# Patient Record
Sex: Male | Born: 1985 | Race: White | Hispanic: No | Marital: Single | Smoking: Current every day smoker
Health system: Southern US, Community
[De-identification: ages and names within clinical notes are randomized; demographics above are authoritative.]

## PROBLEM LIST (undated history)

## (undated) DIAGNOSIS — F1910 Other psychoactive substance abuse, uncomplicated: Secondary | ICD-10-CM

## (undated) DIAGNOSIS — I519 Heart disease, unspecified: Secondary | ICD-10-CM

## (undated) DIAGNOSIS — J189 Pneumonia, unspecified organism: Secondary | ICD-10-CM

## (undated) DIAGNOSIS — R079 Chest pain, unspecified: Secondary | ICD-10-CM

## (undated) DIAGNOSIS — Q231 Congenital insufficiency of aortic valve: Secondary | ICD-10-CM

## (undated) SURGERY — ECHOCARDIOGRAM, TRANSESOPHAGEAL
Anesthesia: Moderate Sedation

---
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
## 2007-04-07 ENCOUNTER — Emergency Department (HOSPITAL_COMMUNITY): Admission: EM | Admit: 2007-04-07 | Discharge: 2007-04-07 | Payer: Self-pay | Admitting: Emergency Medicine

## 2007-04-14 ENCOUNTER — Emergency Department (HOSPITAL_COMMUNITY): Admission: EM | Admit: 2007-04-14 | Discharge: 2007-04-14 | Payer: Self-pay | Admitting: Emergency Medicine

## 2007-07-01 ENCOUNTER — Emergency Department (HOSPITAL_COMMUNITY): Admission: EM | Admit: 2007-07-01 | Discharge: 2007-07-01 | Payer: Self-pay | Admitting: Emergency Medicine

## 2007-07-10 ENCOUNTER — Emergency Department (HOSPITAL_COMMUNITY): Admission: EM | Admit: 2007-07-10 | Discharge: 2007-07-10 | Payer: Self-pay | Admitting: Emergency Medicine

## 2007-07-18 ENCOUNTER — Emergency Department (HOSPITAL_COMMUNITY): Admission: EM | Admit: 2007-07-18 | Discharge: 2007-07-18 | Payer: Self-pay | Admitting: Emergency Medicine

## 2009-09-08 ENCOUNTER — Emergency Department (HOSPITAL_COMMUNITY): Admission: EM | Admit: 2009-09-08 | Discharge: 2009-09-09 | Payer: Self-pay | Admitting: Emergency Medicine

## 2009-11-14 ENCOUNTER — Emergency Department (HOSPITAL_COMMUNITY): Admission: EM | Admit: 2009-11-14 | Discharge: 2009-11-15 | Payer: Self-pay | Admitting: Emergency Medicine

## 2010-05-07 LAB — BASIC METABOLIC PANEL
BUN: 10 mg/dL (ref 6–23)
Chloride: 98 mEq/L (ref 96–112)
GFR calc non Af Amer: >60 mL/min (ref >60)
Glucose, Bld: 60 mg/dL — ABNORMAL LOW (ref 70–99)
Potassium: 3.8 mEq/L (ref 3.5–5.1)
Sodium: 136 mEq/L (ref 135–145)

## 2010-05-07 LAB — RAPID URINE DRUG SCREEN, HOSP PERFORMED
Amphetamines: NOT DETECTED
Benzodiazepines: NOT DETECTED
Cocaine: NOT DETECTED
Opiates: NOT DETECTED
Tetrahydrocannabinol: NOT DETECTED

## 2010-05-07 LAB — CBC
HCT: 45.2 % (ref 39.0–52.0)
Hemoglobin: 15.8 g/dL (ref 13.0–17.0)
MCH: 32.6 pg (ref 26.0–34.0)
MCHC: 35 g/dL (ref 30.0–36.0)
MCV: 93.1 fL (ref 78.0–100.0)
Platelets: 181 K/uL (ref 150–400)
RBC: 4.85 MIL/uL (ref 4.22–5.81)
RDW: 12.3 % (ref 11.5–15.5)
WBC: 9 K/uL (ref 4.0–10.5)

## 2010-05-07 LAB — DIFFERENTIAL
Basophils Absolute: 0 K/uL (ref 0.0–0.1)
Basophils Relative: 0 % (ref 0–1)
Eosinophils Absolute: 0.3 K/uL (ref 0.0–0.7)
Eosinophils Relative: 3 % (ref 0–5)
Lymphocytes Relative: 12 % (ref 12–46)
Lymphs Abs: 1.1 10*3/uL (ref 0.7–4.0)
Monocytes Absolute: 0.7 10*3/uL (ref 0.1–1.0)
Monocytes Relative: 8 % (ref 3–12)
Neutro Abs: 6.9 10*3/uL (ref 1.7–7.7)
Neutrophils Relative %: 77 % (ref 43–77)

## 2010-05-07 LAB — BASIC METABOLIC PANEL WITH GFR
CO2: 31 meq/L (ref 19–32)
Calcium: 9.3 mg/dL (ref 8.4–10.5)
Creatinine, Ser: 0.99 mg/dL (ref 0.4–1.5)

## 2010-05-07 LAB — ETHANOL

## 2010-05-09 LAB — DIFFERENTIAL
Basophils Absolute: 0 10*3/uL (ref 0.0–0.1)
Lymphocytes Relative: 38 % (ref 12–46)
Lymphs Abs: 2.9 10*3/uL (ref 0.7–4.0)
Monocytes Absolute: 0.4 10*3/uL (ref 0.1–1.0)
Monocytes Relative: 6 % (ref 3–12)
Neutro Abs: 3.9 10*3/uL (ref 1.7–7.7)

## 2010-05-09 LAB — CBC
HCT: 43.9 % (ref 39.0–52.0)
Hemoglobin: 15.3 g/dL (ref 13.0–17.0)
RBC: 4.8 MIL/uL (ref 4.22–5.81)
RDW: 12 % (ref 11.5–15.5)
WBC: 7.7 10*3/uL (ref 4.0–10.5)

## 2010-05-09 LAB — POCT I-STAT, CHEM 8
BUN: 9 mg/dL (ref 6–23)
Calcium, Ion: 1.09 mmol/L — ABNORMAL LOW (ref 1.12–1.32)
Chloride: 105 mEq/L (ref 96–112)
Creatinine, Ser: 1.1 mg/dL (ref 0.4–1.5)

## 2010-05-09 LAB — RAPID URINE DRUG SCREEN, HOSP PERFORMED: Opiates: NOT DETECTED

## 2010-05-09 LAB — ETHANOL

## 2011-05-07 ENCOUNTER — Encounter (HOSPITAL_COMMUNITY): Payer: Self-pay | Admitting: *Deleted

## 2011-05-07 ENCOUNTER — Emergency Department (HOSPITAL_COMMUNITY)
Admission: EM | Admit: 2011-05-07 | Discharge: 2011-05-07 | Disposition: A | Discharge status: Hospice | Payer: Self-pay | Attending: Emergency Medicine | Admitting: Emergency Medicine

## 2011-05-07 DIAGNOSIS — J45909 Unspecified asthma, uncomplicated: Secondary | ICD-10-CM | POA: Insufficient documentation

## 2011-05-07 DIAGNOSIS — K0889 Other specified disorders of teeth and supporting structures: Secondary | ICD-10-CM

## 2011-05-07 DIAGNOSIS — K089 Disorder of teeth and supporting structures, unspecified: Secondary | ICD-10-CM | POA: Insufficient documentation

## 2011-05-07 DIAGNOSIS — K029 Dental caries, unspecified: Secondary | ICD-10-CM | POA: Insufficient documentation

## 2011-05-07 MED ORDER — HYDROCODONE-ACETAMINOPHEN 5-325 MG PO TABS: 1.0000 | ORAL_TABLET | ORAL | Status: AC | PRN

## 2011-05-07 MED ORDER — IBUPROFEN 800 MG PO TABS: 800.0000 mg | ORAL_TABLET | Freq: Three times a day (TID) | ORAL | Status: AC

## 2011-05-07 MED ORDER — PENICILLIN V POTASSIUM 500 MG PO TABS: 500.0000 mg | ORAL_TABLET | Freq: Three times a day (TID) | ORAL | Status: AC

## 2011-05-07 NOTE — Discharge Instructions (Signed)
Take antibiotic as prescribed. Take vicodin as prescribed for severe pain.   Do not drive within four hours of taking this medication (may cause drowsiness or confusion).  Take ibuprofen w/ food up to three times a day, as well.  Follow up with a dentist as soon as possible.  You have been referred to the dentist on call for the hospital and there is a list attached w/ low cost dental clinics in [place] and [place].Toothache Toothaches are usually caused by tooth decay (cavity). However, other causes of toothache include:  Gum disease.   Cracked tooth.   Cracked filling.   Injury.   Jaw problem (temporo mandibular joint or TMJ disorder).   Tooth abscess.   Root sensitivity.   Grinding.   Eruption problems.  Swelling and redness around a painful tooth often means you have a dental abscess. Pain medicine and antibiotics can help reduce symptoms, but you will need to see a dentist within the next few days to have your problem properly evaluated and treated. If tooth decay is the problem, you may need a filling or root canal to save your tooth. If the problem is more severe, your tooth may need to be pulled. SEEK IMMEDIATE MEDICAL CARE IF:  You cannot swallow.   You develop severe swelling, increased redness, or increased pain in your mouth or face.   You have a fever.   You cannot open your mouth adequately.  Document Released: 03/18/2004 Document Revised: 01/28/2011 Document Reviewed: 05/08/2009 [facility] Patient Information 2012 [place], [place].  You should return to the ER if you develop worsening symptoms or facial swelling.

## 2011-05-07 NOTE — ED Provider Notes (Signed)
History     CSN: [id number]  Arrival date & time 05/07/11  1616   First MD Initiated Contact with Patient 05/07/11 1735      Chief Complaint  Patient presents with  . Dental Pain    (Consider location/radiation/quality/duration/timing/severity/associated sxs/prior treatment) HPI History provided by pt.   Pt has had an intermittent, severe, non-radiating, left upper toothache for the past 4-5 months.   Pain gradually worsening and is no longer tolerable.  No modifying factors; no relief w/ OTC pain medication.  Occasionally associated w/ edema and purulent drainage of adjacent gingiva.  No known fever.  Pt does not currently have a dentist.   Past Medical History  Diagnosis Date  . Asthma     History reviewed. No pertinent past surgical history.  History reviewed. No pertinent family history.  History  Substance Use Topics  . Smoking status: Current Everyday Smoker  . Smokeless tobacco: Not on file  . Alcohol Use: Yes      Review of Systems  All other systems reviewed and are negative.    Allergies  Review of patient's allergies indicates no known allergies.  Home Medications   Current Outpatient Rx  Name Route Sig Dispense Refill  . HYDROCODONE-ACETAMINOPHEN 5-325 MG PO TABS Oral Take 1 tablet by mouth every 4 (four) hours as needed for pain. 20 tablet 0  . IBUPROFEN 800 MG PO TABS Oral Take 1 tablet (800 mg total) by mouth 3 (three) times daily. 12 tablet 0  . PENICILLIN V POTASSIUM 500 MG PO TABS Oral Take 1 tablet (500 mg total) by mouth 3 (three) times daily. 30 tablet 0    BP 144/88  Pulse 73  Temp(Src) 97.9 F (36.6 C) (Oral)  Resp 18  SpO2 99%  Physical Exam  Nursing note and vitals reviewed. Constitutional: He is oriented to person, place, and time. He appears well-developed and well-nourished.  HENT:  Head: Normocephalic and atraumatic. No trismus in the jaw.  Mouth/Throat: Uvula is midline and oropharynx is clear and moist.       Left upper  second molar partially avulsed and w/ mild decay.  Non-tender. Adjacent gingiva appears normal.  Eyes:       Normal appearance  Neck: Normal range of motion. Neck supple.  Lymphadenopathy:    He has no cervical adenopathy.  Neurological: He is alert and oriented to person, place, and time.  Psychiatric: He has a normal mood and affect. His behavior is normal.    ED Course  Procedures (including critical care time)  Labs Reviewed - No data to display No results found.   1. Pain, dental       MDM  26yo M presents w/ 4-5 months of left upper second molar pain.  Does not appear to be infected on exam but w/ h/o intermittent edema and purulent drainage of adjacent gingiva, will treat for dental infection.  D/c'd home w/ penicillin, vicodin, ibuprofen and referral to dentist on call as well as list of several low cost dental clinics in [place].          Arie Sabina Shively, [place] 05/07/11 [phone]

## 2011-05-07 NOTE — ED Notes (Signed)
The pt has had a toothache for the past 3-4 days

## 2011-05-07 NOTE — ED Notes (Addendum)
Pt c/o toothache x3 yrs, states "I'm tired of wasting all my money on goody powders." Pt reports he was informed years ago w/abscess to (L) bottom molar and a filling fell out of top (L) molar. Pt reports pain started 4-5 months ago and he is unable to work at times d/t increase pain, pt reports he was out of work 3 months w/last abscess.

## 2011-05-07 NOTE — ED Notes (Signed)
Pt reports the only thing that relieves his pain is to "chug a gallon of liquor." Pain medications do not help, they just make him dizzy

## 2011-05-08 NOTE — ED Provider Notes (Signed)
Medical screening examination/treatment/procedure(s) were performed by non-physician practitioner and as supervising physician I was immediately available for consultation/collaboration.  Aeisha Minarik L Kaislee Chao, MD 05/08/11 1704 

## 2013-01-15 ENCOUNTER — Encounter (HOSPITAL_COMMUNITY): Payer: Self-pay | Admitting: Emergency Medicine

## 2013-01-15 ENCOUNTER — Emergency Department (HOSPITAL_COMMUNITY)
Admission: EM | Admit: 2013-01-15 | Discharge: 2013-01-15 | Disposition: A | Discharge status: Home / Self Care | Payer: Self-pay | Attending: Emergency Medicine | Admitting: Emergency Medicine

## 2013-01-15 DIAGNOSIS — K089 Disorder of teeth and supporting structures, unspecified: Secondary | ICD-10-CM | POA: Insufficient documentation

## 2013-01-15 DIAGNOSIS — F17200 Nicotine dependence, unspecified, uncomplicated: Secondary | ICD-10-CM | POA: Insufficient documentation

## 2013-01-15 DIAGNOSIS — R51 Headache: Secondary | ICD-10-CM | POA: Insufficient documentation

## 2013-01-15 DIAGNOSIS — J45909 Unspecified asthma, uncomplicated: Secondary | ICD-10-CM | POA: Insufficient documentation

## 2013-01-15 DIAGNOSIS — K0889 Other specified disorders of teeth and supporting structures: Secondary | ICD-10-CM

## 2013-01-15 MED ORDER — PENICILLIN V POTASSIUM 500 MG PO TABS: 500.0000 mg | ORAL_TABLET | Freq: Four times a day (QID) | ORAL | Status: AC

## 2013-01-15 MED ORDER — HYDROCODONE-ACETAMINOPHEN 5-325 MG PO TABS: 1.0000 | ORAL_TABLET | ORAL | Status: DC | PRN

## 2013-01-15 MED ORDER — IBUPROFEN 800 MG PO TABS: 800.0000 mg | ORAL_TABLET | Freq: Three times a day (TID) | ORAL | Status: DC | PRN

## 2013-01-15 NOTE — ED Provider Notes (Signed)
CSN: [id number]     Arrival date & time 01/15/13  1230 History  This chart was scribed for non-physician practitioner Trixie Dredge, PA-C, working with Dagmar Hait, MD by Dorothey Baseman, ED Scribe. This patient was seen in room TR04C/TR04C and the patient's care was started at 3:10 PM.    Chief Complaint  Patient presents with  . Dental Pain   The history is provided by the patient. No language interpreter was used.   HPI Comments: Paul Mcdowell. is a 27 y.o. male who presents to the Emergency Department complaining of a constant, severe pain to the left, upper dentition onset about 1-1.5 weeks ago that is exacerbated with eating. Patient reports that a filling fell out from one of the molars in that area about 6 months ago and has been progressively deteriorating. Patient reports associated headache. He reports taking Goody powder, Excedrin, and Tylenol Extra Strength at home without relief. He denies fever, chills, sore throat, shortness of breath, facial swelling, difficulty swallowing or speaking. Patient has no other pertinent medical history.   Past Medical History  Diagnosis Date  . Asthma    No past surgical history on file. No family history on file. History  Substance Use Topics  . Smoking status: Current Every Day Smoker  . Smokeless tobacco: Not on file  . Alcohol Use: Yes    Review of Systems  Constitutional: Negative for fever and chills.  HENT: Positive for dental problem. Negative for facial swelling, sore throat and trouble swallowing.   Respiratory: Negative for shortness of breath.   Neurological: Positive for headaches. Negative for speech difficulty.    Allergies  Review of patient's allergies indicates no known allergies.  Home Medications   Current Outpatient Rx  Name  Route  Sig  Dispense  Refill  . acetaminophen (TYLENOL) 500 MG tablet   Oral   Take 1,000 mg by mouth every 6 (six) hours as needed for moderate pain.         .  Aspirin-Acetaminophen-Caffeine (GOODY HEADACHE PO)   Oral   Take 2 packets by mouth every 4 (four) hours as needed (pain).         . Diphenhydramine-APAP, sleep, (EXCEDRIN PM PO)   Oral   Take 2 tablets by mouth at bedtime as needed (pain/sleep).         . Multiple Vitamin (ONE-A-DAY MENS PO)   Oral   Take 1 tablet by mouth daily.          Triage Vitals: BP 148/81  Pulse 80  Temp(Src) 98.1 F (36.7 C) (Oral)  Resp 20  Ht 5\' 11"  (1.803 m)  Wt 151 lb (68.493 kg)  BMI 21.07 kg/m2  SpO2 100%  Physical Exam  Nursing note and vitals reviewed. Constitutional: He is oriented to person, place, and time. He appears well-developed and well-nourished. No distress.  HENT:  Head: Normocephalic and atraumatic.  Mouth/Throat: Oropharynx is clear and moist.  Left, upper 3rd molar with remote fracture and decay that is tender to palpation. No adjacent edema or erythema of the gingiva. No facial swelling.   Eyes: Conjunctivae are normal.  Neck: Normal range of motion. Neck supple.  No paratracheal tenderness.   Pulmonary/Chest: Effort normal. No respiratory distress.  Abdominal: He exhibits no distension.  Musculoskeletal: Normal range of motion.  Lymphadenopathy:    He has no cervical adenopathy.  Neurological: He is alert and oriented to person, place, and time.  Skin: Skin is warm and dry. He is  not diaphoretic.  Psychiatric: He has a normal mood and affect. His behavior is normal.    ED Course  Procedures (including critical care time)  DIAGNOSTIC STUDIES: Oxygen Saturation is 100% on room air, normal by my interpretation.    COORDINATION OF CARE: 3:31 PM- Advised patient to follow up with the referred dentist. Will discharge patient with antibiotics and pain medication. Discussed treatment plan with patient at bedside and patient verbalized agreement.     Labs Review Labs Reviewed - No data to display Imaging Review No results found.  EKG Interpretation   None        MDM   1. Pain, dental    Pt with left upper molar dental pain in decayed tooth.  Pain is new as of this week. No obvious abscess but will treat for infection given tenderness and constant pain.  Afebrile, nontoxic.  No airway concerns.  Discussed result, findings, treatment, and follow up  with patient.  Pt given return precautions.  Pt verbalizes understanding and agrees with plan.      I doubt any other EMC precluding discharge at this time including, but not necessarily limited to the following: deep space head or neck infection, ludwig's angina.    I personally performed the services described in this documentation, which was scribed in my presence. The recorded information has been reviewed and is accurate.     Trixie Dredge, PA-C 01/15/13 1540

## 2013-01-15 NOTE — ED Notes (Signed)
Patient states L upper back molar had a filling that fell out about 6 months ago.   Patient states it has steadily been chipping away since.  Patient started having pain just recently and wants to have extracted.   Patient states has been taking goody powder, excedrin and tylenol at home for 1 wk.

## 2013-01-16 NOTE — ED Provider Notes (Signed)
Medical screening examination/treatment/procedure(s) were performed by non-physician practitioner and as supervising physician I was immediately available for consultation/collaboration.  EKG Interpretation   None        Shon Baton, MD 01/16/13 1037

## 2013-03-08 ENCOUNTER — Emergency Department (HOSPITAL_COMMUNITY): Payer: Self-pay

## 2013-03-08 ENCOUNTER — Encounter (HOSPITAL_COMMUNITY): Payer: Self-pay | Admitting: Emergency Medicine

## 2013-03-08 DIAGNOSIS — F17200 Nicotine dependence, unspecified, uncomplicated: Secondary | ICD-10-CM | POA: Insufficient documentation

## 2013-03-08 DIAGNOSIS — R071 Chest pain on breathing: Secondary | ICD-10-CM | POA: Insufficient documentation

## 2013-03-08 DIAGNOSIS — Z79899 Other long term (current) drug therapy: Secondary | ICD-10-CM | POA: Insufficient documentation

## 2013-03-08 LAB — POCT I-STAT TROPONIN I: TROPONIN I, POC: 0.02 ng/mL (ref 0.00–0.08)

## 2013-03-08 NOTE — ED Notes (Signed)
Pt also states SOB

## 2013-03-08 NOTE — ED Notes (Signed)
Pt states left side sharp CP that is intermittent. Smoking makes the pain feel worse, nothing makes the pain feel better

## 2013-03-09 ENCOUNTER — Emergency Department (HOSPITAL_COMMUNITY)
Admission: EM | Admit: 2013-03-09 | Discharge: 2013-03-09 | Disposition: A | Discharge status: Home / Self Care | Payer: Self-pay | Attending: Emergency Medicine | Admitting: Emergency Medicine

## 2013-03-09 DIAGNOSIS — R071 Chest pain on breathing: Secondary | ICD-10-CM

## 2013-03-09 DIAGNOSIS — R0789 Other chest pain: Secondary | ICD-10-CM

## 2013-03-09 LAB — BASIC METABOLIC PANEL
BUN: 10 mg/dL (ref 6–23)
CALCIUM: 9.2 mg/dL (ref 8.4–10.5)
CO2: 22 mEq/L (ref 19–32)
Chloride: 100 mEq/L (ref 96–112)
Creatinine, Ser: 1.02 mg/dL (ref 0.50–1.35)
GFR calc Af Amer: >90 mL/min (ref >90)
GLUCOSE: 74 mg/dL (ref 70–99)
POTASSIUM: 3.9 meq/L (ref 3.7–5.3)
SODIUM: 140 meq/L (ref 137–147)

## 2013-03-09 LAB — CBC
HCT: 47.9 % (ref 39.0–52.0)
HEMOGLOBIN: 17.3 g/dL — AB (ref 13.0–17.0)
MCH: 32.2 pg (ref 26.0–34.0)
MCHC: 36.1 g/dL — AB (ref 30.0–36.0)
MCV: 89 fL (ref 78.0–100.0)
PLATELETS: 244 10*3/uL (ref 150–400)
RBC: 5.38 MIL/uL (ref 4.22–5.81)
RDW: 11.9 % (ref 11.5–15.5)
WBC: 8.1 10*3/uL (ref 4.0–10.5)

## 2013-03-09 LAB — PRO B NATRIURETIC PEPTIDE: PRO B NATRI PEPTIDE: 14.9 pg/mL (ref 0–125)

## 2013-03-09 MED ORDER — ALBUTEROL SULFATE HFA 108 (90 BASE) MCG/ACT IN AERS
1.0000 | INHALATION_SPRAY | RESPIRATORY_TRACT | Status: DC | PRN
  Administered 2013-03-09: 2 via RESPIRATORY_TRACT
  Filled 2013-03-09: qty 6.7

## 2013-03-09 MED ORDER — NAPROXEN 500 MG PO TABS: 500.0000 mg | ORAL_TABLET | Freq: Two times a day (BID) | ORAL | Status: DC

## 2013-03-09 MED ORDER — NAPROXEN 250 MG PO TABS
500.0000 mg | ORAL_TABLET | Freq: Two times a day (BID) | ORAL | Status: DC
Start: 2013-03-09 — End: 2013-03-09
  Administered 2013-03-09: 500 mg via ORAL
  Filled 2013-03-09: qty 2

## 2013-03-09 NOTE — Discharge Instructions (Signed)
Chest Wall Pain °Chest wall pain is pain in or around the bones and muscles of your chest. It may take up to 6 weeks to get better. It may take longer if you must stay physically active in your work and activities.  °CAUSES  °Chest wall pain may happen on its own. However, it may be caused by: °· A viral illness like the flu. °· Injury. °· Coughing. °· Exercise. °· Arthritis. °· Fibromyalgia. °· Shingles. °HOME CARE INSTRUCTIONS  °· Avoid overtiring physical activity. Try not to strain or perform activities that cause pain. This includes any activities using your chest or your abdominal and side muscles, especially if heavy weights are used. °· Put ice on the sore area. °· Put ice in a plastic bag. °· Place a towel between your skin and the bag. °· Leave the ice on for 15-20 minutes per hour while awake for the first 2 days. °· Only take over-the-counter or prescription medicines for pain, discomfort, or fever as directed by your caregiver. °SEEK IMMEDIATE MEDICAL CARE IF:  °· Your pain increases, or you are very uncomfortable. °· You have a fever. °· Your chest pain becomes worse. °· You have new, unexplained symptoms. °· You have nausea or vomiting. °· You feel sweaty or lightheaded. °· You have a cough with phlegm (sputum), or you cough up blood. °MAKE SURE YOU:  °· Understand these instructions. °· Will watch your condition. °· Will get help right away if you are not doing well or get worse. °Document Released: 02/08/2005 Document Revised: 05/03/2011 Document Reviewed: 10/05/2010 °[employer]® Patient Information ©2014 [employer]. ° °Costochondritis °Costochondritis, sometimes called Tietze syndrome, is a swelling and irritation (inflammation) of the tissue (cartilage) that connects your ribs with your breastbone (sternum). It causes pain in the chest and rib area. Costochondritis usually goes away on its own over time. It can take up to 6 weeks or longer to get better, especially if you are unable to limit your  activities. °CAUSES  °Some cases of costochondritis have no known cause. Possible causes include: °· Injury (trauma). °· Exercise or activity such as lifting. °· Severe coughing. °SIGNS AND SYMPTOMS °· Pain and tenderness in the chest and rib area. °· Pain that gets worse when coughing or taking deep breaths. °· Pain that gets worse with specific movements. °DIAGNOSIS  °Your health care provider will do a physical exam and ask about your symptoms. Chest X-rays or other tests may be done to rule out other problems. °TREATMENT  °Costochondritis usually goes away on its own over time. Your health care provider may prescribe medicine to help relieve pain. °HOME CARE INSTRUCTIONS  °· Avoid exhausting physical activity. Try not to strain your ribs during normal activity. This would include any activities using chest, abdominal, and side muscles, especially if heavy weights are used. °· Apply ice to the affected area for the first 2 days after the pain begins. °· Put ice in a plastic bag. °· Place a towel between your skin and the bag. °· Leave the ice on for 20 minutes, 2 3 times a day. °· Only take over-the-counter or prescription medicines as directed by your health care provider. °SEEK MEDICAL CARE IF: °· You have redness or swelling at the rib joints. These are signs of infection. °· Your pain does not go away despite rest or medicine. °SEEK IMMEDIATE MEDICAL CARE IF:  °· Your pain increases or you are very uncomfortable. °· You have shortness of breath or difficulty breathing. °· You   cough up blood. °· You have worse chest pains, sweating, or vomiting. °· You have a fever or persistent symptoms for more than 2 3 days. °· You have a fever and your symptoms suddenly get worse. °MAKE SURE YOU:  °· Understand these instructions. °· Will watch your condition. °· Will get help right away if you are not doing well or get worse. °Document Released: 11/18/2004 Document Revised: 11/29/2012 Document Reviewed:  09/12/2012 °Exit[employer]® Patient Information ©2014 [employer]. ° °

## 2013-03-09 NOTE — ED Provider Notes (Signed)
CSN: [id number]     Arrival date & time 03/08/13  2322 History   First MD Initiated Contact with Patient 03/09/13 [phone]     Chief Complaint  Patient presents with  . Chest Pain   (Consider location/radiation/quality/duration/timing/severity/associated sxs/prior Treatment) HPI 28 year old male presents to emergency room with complaint of left upper chest.  Pain.  Pain started tonight around midnight while he was sitting watching TV.  Pain is sharp, worse with movement and palpation.  He's had similar pains in the past, but is never sought medical care.  Pain usually lasts a few minutes, and then resolves.  Patient currently smokes one half pack per day.  He reports is decreased from almost 2 packs a few months ago.  Patient denies any significant medical problems.  No family history of coronary disease.  Pain is currently resolved without intervention. Past Medical History  Diagnosis Date  . Asthma    No past surgical history on file. No family history on file. History  Substance Use Topics  . Smoking status: Current Every Day Smoker -- 0.50 packs/day    Types: Cigarettes  . Smokeless tobacco: Not on file  . Alcohol Use: Yes    Review of Systems  See History of Present Illness; otherwise all other systems are reviewed and negative Allergies  Molds & smuts  Home Medications   Current Outpatient Rx  Name  Route  Sig  Dispense  Refill  . albuterol-ipratropium (COMBIVENT) 18-103 MCG/ACT inhaler   Inhalation   Inhale 2 puffs into the lungs 2 (two) times daily.         . Multiple Vitamin (ONE-A-DAY MENS PO)   Oral   Take 1 tablet by mouth 2 (two) times daily.          . naproxen (NAPROSYN) 500 MG tablet   Oral   Take 1 tablet (500 mg total) by mouth 2 (two) times daily with a meal.   30 tablet   0    BP 143/77  Pulse 64  Temp(Src) 98.1 F (36.7 C) (Oral)  Resp 16  SpO2 100% Physical Exam  Nursing note and vitals reviewed. Constitutional: He is oriented to person,  place, and time. He appears well-developed and well-nourished.  HENT:  Head: Normocephalic and atraumatic.  Nose: Nose normal.  Mouth/Throat: Oropharynx is clear and moist.  Eyes: Conjunctivae and EOM are normal. Pupils are equal, round, and reactive to light.  Neck: Normal range of motion. Neck supple. No JVD present. No tracheal deviation present. No thyromegaly present.  Cardiovascular: Normal rate, regular rhythm, normal heart sounds and intact distal pulses.  Exam reveals no gallop and no friction rub.   No murmur heard. Pulmonary/Chest: Effort normal and breath sounds normal. No stridor. No respiratory distress. He has no wheezes. He has no rales. He exhibits no tenderness (tender to palpation to left upper chest and also along sternal maarrgiin on left side.).  Abdominal: Soft. Bowel sounds are normal. He exhibits no distension and no mass. There is no tenderness. There is no rebound and no guarding.  Musculoskeletal: Normal range of motion. He exhibits no edema and no tenderness.  Lymphadenopathy:    He has no cervical adenopathy.  Neurological: He is alert and oriented to person, place, and time. He exhibits normal muscle tone. Coordination normal.  Skin: Skin is warm and dry. No rash noted. No erythema. No pallor.  Psychiatric: He has a normal mood and affect. His behavior is normal. Judgment and thought content normal.  ED Course  Procedures (including critical care time) Labs Review Labs Reviewed  CBC - Abnormal; Notable for the following:    Hemoglobin 17.3 (*)    MCHC 36.1 (*)    All other components within normal limits  BASIC METABOLIC PANEL  PRO B NATRIURETIC PEPTIDE  POCT I-STAT TROPONIN I   Imaging Review Dg Chest 2 View  03/09/2013   CLINICAL DATA:  Chest pain.  EXAM: CHEST  2 VIEW  COMPARISON:  None.  FINDINGS: Normal heart size and mediastinal contours. No acute infiltrate or edema. No effusion or pneumothorax. No acute osseous findings.  IMPRESSION: No active  cardiopulmonary disease.   Electronically Signed   By: Tiburcio PeaJonathan  Watts M.D.   On: 03/09/2013 00:54    EKG Interpretation    Date/Time:  Thursday March 08 2013 23:26:54 EST Ventricular Rate:  81 PR Interval:  132 QRS Duration: 86 QT Interval:  356 QTC Calculation: 413 R Axis:   87 Text Interpretation:  Normal sinus rhythm Minimal voltage criteria for LVH, may be normal variant Borderline ECG No old tracing to compare Confirmed by Danila Eddie  MD, Krystin Keeven ([id number]) on 03/09/2013 4:23:58 AM            MDM   1. Chest wall pain   2. Costochondral chest pain    28 year old male with chest wall pain.  Will prescribe NSAIDs.   Olivia Mackielga M Sabrina Keough, MD 03/09/13 [phone]

## 2013-03-09 NOTE — ED Notes (Signed)
Pt states that he was having an episode of CP that felt like it triggered a twitching in his arms. Pt states that his pain got worse with movement, taking a deep breath. Pt denies N/V, SOB

## 2013-06-12 ENCOUNTER — Emergency Department (HOSPITAL_COMMUNITY)
Admission: EM | Admit: 2013-06-12 | Discharge: 2013-06-12 | Disposition: A | Discharge status: Home / Self Care | Payer: Self-pay | Attending: Emergency Medicine | Admitting: Emergency Medicine

## 2013-06-12 ENCOUNTER — Encounter (HOSPITAL_COMMUNITY): Payer: Self-pay | Admitting: Emergency Medicine

## 2013-06-12 DIAGNOSIS — J45909 Unspecified asthma, uncomplicated: Secondary | ICD-10-CM | POA: Insufficient documentation

## 2013-06-12 DIAGNOSIS — F17200 Nicotine dependence, unspecified, uncomplicated: Secondary | ICD-10-CM | POA: Insufficient documentation

## 2013-06-12 DIAGNOSIS — K002 Abnormalities of size and form of teeth: Secondary | ICD-10-CM | POA: Insufficient documentation

## 2013-06-12 DIAGNOSIS — K047 Periapical abscess without sinus: Secondary | ICD-10-CM | POA: Insufficient documentation

## 2013-06-12 MED ORDER — IBUPROFEN 800 MG PO TABS: 800.0000 mg | ORAL_TABLET | Freq: Three times a day (TID) | ORAL | Status: DC

## 2013-06-12 MED ORDER — PENICILLIN V POTASSIUM 500 MG PO TABS: 500.0000 mg | ORAL_TABLET | Freq: Four times a day (QID) | ORAL | Status: AC

## 2013-06-12 MED ORDER — OXYCODONE-ACETAMINOPHEN 5-325 MG PO TABS: 2.0000 | ORAL_TABLET | ORAL | Status: DC | PRN

## 2013-06-12 NOTE — ED Provider Notes (Signed)
CSN: [id number]     Arrival date & time 06/12/13  0116 History   First MD Initiated Contact with Patient 06/12/13 0151     Chief Complaint  Patient presents with  . Dental Pain     (Consider location/radiation/quality/duration/timing/severity/associated sxs/prior Treatment) HPI History per patient. Chronic recurrent left upper molar pain. More severe over the last 2 days, tried to pull his tooth out tonight with pliers. He was able to get a chunk of his tooth out. No facial swelling. No fevers. No vomiting. No difficulty swallowing or breathing. Hurts to chew on that side. Pain is sharp and moderate to severe. No known alleviating factors. Taking goodie powders with intermittent minimal relief.  Past Medical History  Diagnosis Date  . Asthma    History reviewed. No pertinent past surgical history. No family history on file. History  Substance Use Topics  . Smoking status: Current Every Day Smoker -- 0.50 packs/day    Types: Cigarettes  . Smokeless tobacco: Not on file  . Alcohol Use: Yes    Review of Systems  Constitutional: Negative for fever and chills.  HENT: Positive for dental problem. Negative for sore throat.   Eyes: Negative for pain.  Respiratory: Negative for shortness of breath.   Cardiovascular: Negative for chest pain.  Gastrointestinal: Negative for abdominal pain.  Genitourinary: Negative for difficulty urinating.  Musculoskeletal: Negative for neck pain.  Skin: Negative for rash.  Neurological: Negative for weakness and headaches.  All other systems reviewed and are negative.     Allergies  Molds & smuts  Home Medications   Prior to Admission medications   Not on File   BP 138/80  Pulse 79  Temp(Src) 98 F (36.7 C) (Oral)  Resp 18  SpO2 99% Physical Exam  Constitutional: He is oriented to person, place, and time. He appears well-developed and well-nourished.  HENT:  Head: Normocephalic and atraumatic.  Left Ear: External ear normal.  Very  poor dentition. Tender left upper first molar. No associated trismus, gingival swelling or fluctuance, facial erythema or swelling.   Eyes: EOM are normal. Pupils are equal, round, and reactive to light.  Neck: Neck supple.  Cardiovascular: Regular rhythm and intact distal pulses.   Pulmonary/Chest: Effort normal. No respiratory distress.  Musculoskeletal: Normal range of motion. He exhibits no edema.  Lymphadenopathy:    He has no cervical adenopathy.  Neurological: He is alert and oriented to person, place, and time.  Skin: Skin is warm and dry.    ED Course  Dental Date/Time: 06/12/2013 2:52 AM Performed by: Sunnie NielsenPITZ, Aquila Menzie Authorized by: Sunnie NielsenPITZ, Tava Peery Consent: Verbal consent obtained. Risks and benefits: risks, benefits and alternatives were discussed Consent given by: patient Patient understanding: patient states understanding of the procedure being performed Required items: required blood products, implants, devices, and special equipment available Patient identity confirmed: verbally with patient Time out: Immediately prior to procedure a "time out" was called to verify the correct patient, procedure, equipment, support staff and site/side marked as required. Preparation: Patient was prepped and draped in the usual sterile fashion. Local anesthesia used: yes Local anesthetic: bupivacaine 0.5% with epinephrine Anesthetic total: 1.8 ml Patient tolerance: Patient tolerated the procedure well with no immediate complications. Comments: Local dental block performed to left upper first molar - adequate anesthesia achieved   (including critical care time) Labs Review Labs Reviewed - No data to display  Plan discharge home with prescription for penicillin, Percocet and Motrin. Dental referral provided. Return precautions provided. MDM   Diagnosis: Dental  abscess  Chronic dental pain , tried to pull his tooth out at home tonight  Dental block performed Vital Signs and nursing notes  reviewed and considered     Sunnie NielsenBrian Yosiel Thieme, MD 06/12/13 [phone]

## 2013-06-12 NOTE — Discharge Instructions (Signed)
Abscess An abscess is an infected area that contains a collection of pus and debris.It can occur in almost any part of the body. An abscess is also known as a furuncle or boil. CAUSES  An abscess occurs when tissue gets infected. This can occur from blockage of oil or sweat glands, infection of hair follicles, or a minor injury to the skin. As the body tries to fight the infection, pus collects in the area and creates pressure under the skin. This pressure causes pain. People with weakened immune systems have difficulty fighting infections and get certain abscesses more often.  SYMPTOMS Usually an abscess develops on the skin and becomes a painful mass that is red, warm, and tender. If the abscess forms under the skin, you may feel a moveable soft area under the skin. Some abscesses break open (rupture) on their own, but most will continue to get worse without care. The infection can spread deeper into the body and eventually into the bloodstream, causing you to feel ill.  DIAGNOSIS  Your caregiver will take your medical history and perform a physical exam. A sample of fluid may also be taken from the abscess to determine what is causing your infection. TREATMENT  Your caregiver may prescribe antibiotic medicines to fight the infection. However, taking antibiotics alone usually does not cure an abscess. Your caregiver may need to make a small cut (incision) in the abscess to drain the pus. In some cases, gauze is packed into the abscess to reduce pain and to continue draining the area. HOME CARE INSTRUCTIONS   Only take over-the-counter or prescription medicines for pain, discomfort, or fever as directed by your caregiver.  If you were prescribed antibiotics, take them as directed. Finish them even if you start to feel better.  If gauze is used, follow your caregiver's directions for changing the gauze.  To avoid spreading the infection:  Keep your draining abscess covered with a  bandage.  Wash your hands well.  Do not share personal care items, towels, or whirlpools with others.  Avoid skin contact with others.  Keep your skin and clothes clean around the abscess.  Keep all follow-up appointments as directed by your caregiver. SEEK MEDICAL CARE IF:   You have increased pain, swelling, redness, fluid drainage, or bleeding.  You have muscle aches, chills, or a general ill feeling.  You have a fever. MAKE SURE YOU:   Understand these instructions.  Will watch your condition.  Will get help right away if you are not doing well or get worse. Document Released: 11/18/2004 Document Revised: 08/10/2011 Document Reviewed: 04/23/2011 [facility] Patient Information 2014 [place], [place].  Dental Extraction A dental extraction procedure refers to a routine tooth extraction performed by your dentist. The procedure depends on where and how the tooth is positioned. The procedure can be very quick, sometimes lasting only seconds. Reasons for dental extraction include:  Tooth decay.  Infections (abcesses).  The need to make room for other teeth.  Gum disease s where the supporting bone has been destroyed.  Fractures of the tooth leaving it unrestorable.  Extra teeth (supernumerary) or grossly malformed teeth.  Baby teeththat have not fallen out in time and have not permitted the the permanent teeth to erupt properly.  In preparation for braces where there is not enough room to align the teeth properly.  Not enough room for wisdom teeth (particularly those that are impacted).  Prior to receiving radiation to the head and neck,teeth in the field of radiation may  need to be extracted. LET YOUR CAREGIVER KNOW ABOUT:  Any allergies.  All medicines you are taking:  Including herbs, eye drops, over-the-counter medications, and creams.  Blood thinners (anticoagulants), aspirin or other drugs that may affect blood clotting.  Use of steroids (through mouth or  as creams).  Previous problems with anesthetics, including local anesthetics.  History of bleeding or blood problems.  Previous surgery.  Possibility of pregnancy if this applies.  Smoking history.  Any health problems. RISKS AND COMPLICATIONS As with any procedure, complications may occur, but they can usually be managed by your caregiver. General surgical complications may include:  Reaction to anesthesia.  Damage to surrounding teeth, nerves, tissues, or structures.  Infection.  Bleeding. With appropriate treatment and care after surgery, the following complications are very uncommon:  Dry socket (blood clot does not form or stay in place over empty socket). This can delay healing.  Incomplete extraction of roots.  Jawbone injury, pain, or weakness. BEFORE THE PROCEDURE  Your dental care provider will:  Take a medical and dental history.  Take an X-ray to evaluate the circumstances and how to best extract the tooth.  Do an oral exam.  Depending on the situation, antibiotics may be given before or after the extraction, or before and after.  Your caregivers may review the procedure, the local anaesthesia and/or sedation being used, and what to expect after the procedure with you.  If needed, your dentist may give you a form of sedation, either by medicine you swallow, gas, or intravenously (IV). This will help to relieve anxiety. Complicated extractions may require the use of general anaesthesia. It is important to follow your caregiver's instructions prior to your procedure to avoid complications. Steps before your procedure may include:  Alert your caregiver if you feel ill (sore throat, fever, upset stomach, etc.) in the days leading up to your procedure.  Stop taking certain medications for several days prior to your procedure such as blood thinners.  Take certain medications, such as antibiotics.  Avoid eating and drinking for several hours before the  procedure. This will help you to avoid complications from the sedation or anaesthesia.  Sign a patient consent form.  Have a friend or family member drive you to the dentist and drive you home after the procedure.  Wear comfortable, loose clothing. Limit makeup and jewelry.  Quit smoking. If you are a smoker, this will raise the chances of a healing problem after your procedure. If you are thinking about quitting, talk to your surgeon about how long before the operation you should stop smoking. You may also get help from your primary caregiver. PROCEDURE Dental extraction is typically done as an outpatient procedure. IV sedation, local anesthesia, or both may be used. It will keep you comfortable and free of pain during the procedure.  There are 2 types of extractions:  Simple extraction involves a tooth that is visible in the mouth and above the gum line. After local anesthetic is given by injection, and the area is numbed, the dentist will loosen the tooth with a special instrument (elevator). Then another instrument (forceps) will be used to grasp the tooth and remove it from its socket. During the procedure you will feel some pressure, but you should not feel pain. If you do feel pain, tell your dentist. The open socket will be cleaned. Dressings (gauze) will be placed in the socket to reduce bleeding.  Surgical extractions are used if the tooth has not come into the mouth  or the tooth is broken off below the gum line. The dentist will make a cut (incision) in the gum and may have to remove some of the bone around the tooth to aid in the removal of the tooth. After removal, stitches (sutures) may be required to close area to help in healing and control bleeding. For some surgical extractions, you may need a general anesthetic or IV sedation (through the vein). After both types of extractions, you may be given pain medication or other drugs to help healing. Other post operative instructions will  be given by your dental caregiver. AFTER THE PROCEDURE  You will have gauze in your mouth where the tooth was removed. Gentle pressure on the gauze for up to 1 hour will help to control bleeding.  A blood clot will begin to form over the open socket. This is normal. Do not touch the area or rinse it.  Your pain will be controlled with medication and self-care.  You will be given detailed instructions for care after surgery. PROGNOSIS While some discomfort is normal after tooth extraction, most patients recover fully in just a few days. SEEK IMMEDIATE DENTAL CARE  You have uncontrolled bleeding, marked swelling, or severe pain.  You develop a fever, difficulty swallowing, or other severe symptoms.  You have questions or concerns. Document Released: 02/08/2005 Document Revised: 05/03/2011 Document Reviewed: 05/15/2010 [facility] Patient Information 2014 [place], [place].   Emergency Department Resource Guide 1) Find a Doctor and Pay Out of Pocket Although you won't have to find out who is covered by your insurance plan, it is a good idea to ask around and get recommendations. You will then need to call the office and see if the doctor you have chosen will accept you as a new patient and what types of options they offer for patients who are self-pay. Some doctors offer discounts or will set up payment plans for their patients who do not have insurance, but you will need to ask so you aren't surprised when you get to your appointment.  2) Contact Your Local Health Department Not all health departments have doctors that can see patients for sick visits, but many do, so it is worth a call to see if yours does. If you don't know where your local health department is, you can check in your phone book. The CDC also has a tool to help you locate your state's health department, and many state websites also have listings of all of their local health departments.  3) Find a Walk-in Clinic If your  illness is not likely to be very severe or complicated, you may want to try a walk in clinic. These are popping up all over the country in pharmacies, drugstores, and shopping centers. They're usually staffed by nurse practitioners or physician assistants that have been trained to treat common illnesses and complaints. They're usually fairly quick and inexpensive. However, if you have serious medical issues or chronic medical problems, these are probably not your best option.  No Primary Care Doctor: - Call Health Connect at  [phone] - they can help you locate a primary care doctor that  accepts your insurance, provides certain services, etc. - Physician Referral Service- [phone]  Chronic Pain Problems: Organization         Address  Phone   Notes  [facility]  [phone] Patients need to be referred by their primary care doctor.   Medication Assistance: Organization  Address  Phone   Notes  [facility] Medication [facility] [address] [place], [place] [address] [phone] --Must be a resident of [facility] -- Must have NO insurance coverage whatsoever (no Medicaid/ Medicare, etc.) -- The pt. MUST have a primary care doctor that directs their care regularly and follows them in the community   [facility]ist  [phone]   [employer]  [phone]    Agencies that provide inexpensive medical care: Organization         Address  Phone   Notes  [facility]  [phone]   [facility]    [phone]   [facility] [address] [place], [place] [address] [phone]   [facility] [address] [place], [place] [phone]   Planned Parenthood    [phone]   [facility]    [phone]   [facility]  [address], [place] Phone:  [phone], Fax:  [phone] Hours of Operation:   9 am - 6 pm, M-F.  Also accepts Medicaid/Medicare and self-pay.  [facility]  [address], [place] Phone: [phone], Fax: [phone]. Hours of Operation:  8:30 am - 5:30 pm, M-F.  Also accepts Medicaid and self-pay.  [facility] [place] [address] [place] Phone: [phone]   [facility] [address] Natasha Bence [place], [place] [phone], Ext. [phone] Mondays & Thursdays: 7-9 AM.  First 15 patients are seen on a first come, first serve basis.    Medicaid-accepting [facility] Providers:  Organization         Address  Phone   Notes  [facility] [address], [place] [phone] Also accepts self-pay patients.  [facility] [address] Laurell Josephs [place], [place]  [phone]   [facility] [address], [place] [phone]   [facility] [address], [place] [phone]   Renaye Rakers [address], [place]   [phone] Only accepts [place] Access [place] patients after they have their name applied to their card.   Self-Pay (no insurance) in [facility]:  Organization         Address  Phone   Notes  Sickle Cell Patients, [facility] Internal Medicine [address] [place], [place] [phone]   [facility] Urgent Care [address] [place], [place] [phone]   [facility] [place]  [address], [place] [phone]   [facility]/Dr. Osei-Bonsu  [address], [place] or [address], [place] [phone] Phone number for both [place] and [place] locations is the same.  [facility] [address] [place] [phone]   [facility] [address], [place] or [address] [phone]   [facility] [address] [place] [phone], phone; [phone], fax Sees patients 1st and 3rd Saturday of every month.  Must not qualify for public or private insurance (i.e. Medicaid, Medicare, [place] Health Choice, Veterans' Benefits)  Household income should be no more than 200% of the poverty level The clinic cannot treat you if you are pregnant or think you  are pregnant  Sexually transmitted diseases are not treated at the clinic.    Dental Care: Organization         Address  Phone  Notes  [facility] Department of [facility] [address] [place], [place] [phone] Accepts children up to age 76 who are enrolled in [place] or [place] Health Choice; pregnant women with a Medicaid card; and children who have applied for Medicaid or [place] Health Choice, but were declined, whose parents can pay a reduced fee at time of service.  [facility] Department of [facility]  [address], [place] [phone] Accepts children up to age 34 who are enrolled in [place] or [place] Health Choice; pregnant women with a Medicaid card; and children who have applied for Medicaid or [place] Health Choice, but were declined, whose parents can pay a reduced fee at time of service.  [facility]  [address] [place], [place] [phone] Patients are seen by appointment only. Walk-ins are not accepted. [facility] will see patients 50 years of age and older. Monday - Tuesday (8am-5pm) Most Wednesdays (8:30-5pm) $30 per visit, cash only  [facility] Adult Dental Access PROGRAM  [address], [facility] [phone] Patients are seen by appointment only. Walk-ins are not accepted. [facility] will see patients 65 years of age and older. One Wednesday Evening (Monthly: Volunteer Based).  $30 per visit, cash only  [employer]  [phone] for adults; Children under age 71, call Graduate Pediatric Dentistry at  [phone]. Children aged 79-14, please call [phone] to request a pediatric application.  Dental services are provided in all areas of dental care including fillings, crowns and bridges, complete and partial dentures, implants, gum treatment, root canals, and extractions. Preventive care is also provided. Treatment is provided to both adults and children. Patients are selected via a lottery and there is often a waiting list.   [facility] [address], [place]  [phone] [email or web address]   [facility] [address] [place], [place] [phone], Ext. [phone] Second and Fourth Thursday of each month, opens at 6:30 AM; Clinic ends at 9 AM.  Patients are seen on a first-come first-served basis, and a limited number are seen during each clinic.   [facility]  [address] Ether Griffins [place], [place] [phone]   Eligibility Requirements You must have lived in [place], [place], or [place] counties for at least the last three months.   You cannot be eligible for state or federal sponsored [place], including [employer], [place], or [employer].   You generally cannot be eligible for healthcare insurance through your employer.    How to apply: Eligibility screenings are held every Tuesday and Wednesday afternoon from 1:00 pm until 4:00 pm. You do not need an appointment for the interview!  [facility] [address] [place], [place] [phone]   [facility]  [phone]   [facility]  [phone]   [facility] Health Department  [phone]    Behavioral Health Resources in the Community: Intensive Outpatient Programs Organization         Address  Phone  Notes  [facility] Services [address] [place], [place] [phone]   [facility] Outpatient [address] [place], [place] [phone]   ADS: Alcohol &  Drug Svcs [address]  Dr, [place], [place]   [place] [address]  [place], [place] or [phone]   Substance Abuse Resources Organization         Address  Phone  Notes  [facility] Drug Services  [phone]   [place]  [phone]   [facility] [place]   Chinita Pester  [phone]   Residential & Outpatient Substance Abuse Program  [phone]   Psychological Services Organization         Address  Phone  Notes  [facility]  [place]  [phone]   [place] [address], [place] or [phone]    Mobile Crisis Teams Organization         Address  Phone  Notes  [facility], Mobile Crisis Care Unit  [phone]   [facility]  [address] [place], [place]   [place] Levels [address] [place] [phone]    Self-Help/Support Groups Organization         Address  Phone             Notes  [place] of [place] - variety of support groups  [place] Call for more information  Narcotics Anonymous (NA), [facility] [address], [employer] [place]  2 meetings at this location   Special educational needs teacher         Address  Phone  Notes  [facility] [place],    [place]  [phone]   [facility]  [address], [place] [id number], [place], [place]   [place], [place] [phone] Admissions: 8am-3pm M-F  Incentives Substance [place] [address]    [place], [place] [phone]   [facility] [address] [place], [place], [place]   [facility] [address]  [place], [place]   [facility] - Intensive Outpatient [address] Kristeen Mans [address], [place], [place]   [facility]  [place], [place] [phone] or [phone]   [facility] ([facility]) [address] [place], [place] Accepts Medicaid  [facility] [place] [address],  [place] [phone] Substance Abuse/Addiction Treatment   [facility] Organization         Address  Phone  Notes  [facility]  [phone]   Domenic Schwab, PhD [address] Arlis Porta [place], [place]   [phone] or [phone]   [place], [place] [phone]   [facility] [address] [place], [place] [phone] Insurance/Medicaid/sponsorship through [facility] and Families [address]                                    [place], [place] [phone] [place] [address], [place] [phone]    Dr. Adele Schilder  [phone]   [facility] Dept. 1) [address] [place] 2) [place] 3)  [place] [address], [place] [phone]  [phone]   [place] [phone])  [phone] or [phone] (After Hours)

## 2013-06-12 NOTE — ED Notes (Signed)
Pt states episodic dental pain for months.  Pt states he was able to remove part of the tooth on his own.  Left left jaw pain

## 2014-04-21 ENCOUNTER — Encounter (HOSPITAL_COMMUNITY): Payer: Self-pay | Admitting: *Deleted

## 2014-04-21 ENCOUNTER — Emergency Department (HOSPITAL_COMMUNITY)
Admission: EM | Admit: 2014-04-21 | Discharge: 2014-04-22 | Disposition: A | Discharge status: Home / Self Care | Payer: Self-pay | Attending: Emergency Medicine | Admitting: Emergency Medicine

## 2014-04-21 DIAGNOSIS — Y9389 Activity, other specified: Secondary | ICD-10-CM | POA: Insufficient documentation

## 2014-04-21 DIAGNOSIS — Y998 Other external cause status: Secondary | ICD-10-CM | POA: Insufficient documentation

## 2014-04-21 DIAGNOSIS — J45909 Unspecified asthma, uncomplicated: Secondary | ICD-10-CM | POA: Insufficient documentation

## 2014-04-21 DIAGNOSIS — Z23 Encounter for immunization: Secondary | ICD-10-CM | POA: Insufficient documentation

## 2014-04-21 DIAGNOSIS — F10120 Alcohol abuse with intoxication, uncomplicated: Secondary | ICD-10-CM | POA: Insufficient documentation

## 2014-04-21 DIAGNOSIS — Y92009 Unspecified place in unspecified non-institutional (private) residence as the place of occurrence of the external cause: Secondary | ICD-10-CM | POA: Insufficient documentation

## 2014-04-21 DIAGNOSIS — F10920 Alcohol use, unspecified with intoxication, uncomplicated: Secondary | ICD-10-CM

## 2014-04-21 DIAGNOSIS — S60413A Abrasion of left middle finger, initial encounter: Secondary | ICD-10-CM | POA: Insufficient documentation

## 2014-04-21 DIAGNOSIS — T148XXA Other injury of unspecified body region, initial encounter: Secondary | ICD-10-CM

## 2014-04-21 DIAGNOSIS — Z720 Tobacco use: Secondary | ICD-10-CM | POA: Insufficient documentation

## 2014-04-21 MED ORDER — TETANUS-DIPHTH-ACELL PERTUSSIS 5-2.5-18.5 LF-MCG/0.5 IM SUSP
0.5000 mL | Freq: Once | INTRAMUSCULAR | Status: AC
  Administered 2014-04-22: 0.5 mL via INTRAMUSCULAR
  Filled 2014-04-21: qty 0.5

## 2014-04-21 NOTE — ED Notes (Signed)
Pt arrives with [employer]. Pt has a laceration to the middle finger left hand. No bleeding present.
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
## 2014-04-22 NOTE — Discharge Instructions (Signed)
Alcohol Intoxication Paul Mcdowell, follow up with a primary physician within 3 days for continued management.  Refrain from heavy drinking in the future.  If symptoms worsen, come back to the ED immediately.  Thank you. Alcohol intoxication occurs when you drink enough alcohol that it affects your ability to function. It can be mild or very severe. Drinking a lot of alcohol in a short time is called binge drinking. This can be very harmful. Drinking alcohol can also be more dangerous if you are taking medicines or other drugs. Some of the effects caused by alcohol may include:  Loss of coordination.  Changes in mood and behavior.  Unclear thinking.  Trouble talking (slurred speech).  Throwing up (vomiting).  Confusion.  Slowed breathing.  Twitching and shaking (seizures).  Loss of consciousness. HOME CARE  Do not drive after drinking alcohol.  Drink enough water and fluids to keep your pee (urine) clear or pale yellow. Avoid caffeine.  Only take medicine as told by your doctor. GET HELP IF:  You throw up (vomit) many times.  You do not feel better after a few days.  You frequently have alcohol intoxication. Your doctor can help decide if you should see a substance use treatment counselor. GET HELP RIGHT AWAY IF:  You become shaky when you stop drinking.  You have twitching and shaking.  You throw up blood. It may look bright red or like coffee grounds.  You notice blood in your poop (bowel movements).  You become lightheaded or pass out (faint). MAKE SURE YOU:   Understand these instructions.  Will watch your condition.  Will get help right away if you are not doing well or get worse. Document Released: 07/28/2007 Document Revised: 10/11/2012 Document Reviewed: 07/14/2012 [facility] Patient Information 2015 [place], [place]. This information is not intended to replace advice given to you by your health care provider. Make sure you discuss any questions you have with  your health care provider.  Abrasions An abrasion is a cut or scrape of the skin. Abrasions do not go through all layers of the skin. HOME CARE  If a bandage (dressing) was put on your wound, change it as told by your doctor. If the bandage sticks, soak it off with warm.  Wash the area with water and soap 2 times a day. Rinse off the soap. Pat the area dry with a clean towel.  Put on medicated cream (ointment) as told by your doctor.  Change your bandage right away if it gets wet or dirty.  Only take medicine as told by your doctor.  See your doctor within 24-48 hours to get your wound checked.  Check your wound for redness, puffiness (swelling), or yellowish-white fluid (pus). GET HELP RIGHT AWAY IF:   You have more pain in the wound.  You have redness, swelling, or tenderness around the wound.  You have pus coming from the wound.  You have a fever or lasting symptoms for more than 2-3 days.  You have a fever and your symptoms suddenly get worse.  You have a bad smell coming from the wound or bandage. MAKE SURE YOU:   Understand these instructions.  Will watch your condition.  Will get help right away if you are not doing well or get worse. Document Released: 07/28/2007 Document Revised: 11/03/2011 Document Reviewed: 01/12/2011 [facility] Patient Information 2015 [place], [place]. This information is not intended to replace advice given to you by your health care provider. Make sure you discuss any questions you have  with your health care provider. ° °

## 2016-11-17 ENCOUNTER — Other Ambulatory Visit: Payer: Self-pay

## 2016-11-17 ENCOUNTER — Emergency Department (HOSPITAL_COMMUNITY): Payer: Self-pay

## 2016-11-17 ENCOUNTER — Encounter (HOSPITAL_COMMUNITY): Payer: Self-pay | Admitting: Emergency Medicine

## 2016-11-17 ENCOUNTER — Inpatient Hospital Stay (HOSPITAL_COMMUNITY)
Admission: EM | Admit: 2016-11-17 | Discharge: 2016-11-19 | DRG: 194 | Disposition: A | Discharge status: Home / Self Care | Payer: Self-pay | Attending: Internal Medicine | Admitting: Internal Medicine

## 2016-11-17 DIAGNOSIS — R7989 Other specified abnormal findings of blood chemistry: Secondary | ICD-10-CM

## 2016-11-17 DIAGNOSIS — R748 Abnormal levels of other serum enzymes: Secondary | ICD-10-CM

## 2016-11-17 DIAGNOSIS — F1110 Opioid abuse, uncomplicated: Secondary | ICD-10-CM

## 2016-11-17 DIAGNOSIS — W19XXXA Unspecified fall, initial encounter: Secondary | ICD-10-CM | POA: Diagnosis present

## 2016-11-17 DIAGNOSIS — F1010 Alcohol abuse, uncomplicated: Secondary | ICD-10-CM

## 2016-11-17 DIAGNOSIS — F1490 Cocaine use, unspecified, uncomplicated: Secondary | ICD-10-CM

## 2016-11-17 DIAGNOSIS — J45909 Unspecified asthma, uncomplicated: Secondary | ICD-10-CM | POA: Diagnosis present

## 2016-11-17 DIAGNOSIS — F1910 Other psychoactive substance abuse, uncomplicated: Secondary | ICD-10-CM

## 2016-11-17 DIAGNOSIS — F17210 Nicotine dependence, cigarettes, uncomplicated: Secondary | ICD-10-CM | POA: Diagnosis present

## 2016-11-17 DIAGNOSIS — R778 Other specified abnormalities of plasma proteins: Secondary | ICD-10-CM

## 2016-11-17 DIAGNOSIS — I519 Heart disease, unspecified: Secondary | ICD-10-CM | POA: Diagnosis present

## 2016-11-17 DIAGNOSIS — Z79899 Other long term (current) drug therapy: Secondary | ICD-10-CM

## 2016-11-17 DIAGNOSIS — Z91048 Other nonmedicinal substance allergy status: Secondary | ICD-10-CM

## 2016-11-17 DIAGNOSIS — J189 Pneumonia, unspecified organism: Principal | ICD-10-CM | POA: Diagnosis present

## 2016-11-17 DIAGNOSIS — I201 Angina pectoris with documented spasm: Secondary | ICD-10-CM | POA: Diagnosis present

## 2016-11-17 DIAGNOSIS — Q231 Congenital insufficiency of aortic valve: Secondary | ICD-10-CM

## 2016-11-17 DIAGNOSIS — R079 Chest pain, unspecified: Secondary | ICD-10-CM | POA: Diagnosis present

## 2016-11-17 HISTORY — DX: Heart disease, unspecified: I51.9

## 2016-11-17 HISTORY — DX: Other psychoactive substance abuse, uncomplicated: F19.10

## 2016-11-17 HISTORY — DX: Congenital insufficiency of aortic valve: Q23.1

## 2016-11-17 HISTORY — DX: Chest pain, unspecified: R07.9

## 2016-11-17 HISTORY — DX: Pneumonia, unspecified organism: J18.9

## 2016-11-17 LAB — I-STAT TROPONIN, ED
Troponin i, poc: 0.94 ng/mL (ref 0.00–0.08)
Troponin i, poc: 1.14 ng/mL (ref 0.00–0.08)

## 2016-11-17 LAB — RAPID URINE DRUG SCREEN, HOSP PERFORMED
AMPHETAMINES: NOT DETECTED
BENZODIAZEPINES: POSITIVE — AB
Barbiturates: NOT DETECTED
Cocaine: POSITIVE — AB
Opiates: POSITIVE — AB
TETRAHYDROCANNABINOL: POSITIVE — AB

## 2016-11-17 LAB — CBC
HCT: 46.1 % (ref 39.0–52.0)
Hemoglobin: 16.1 g/dL (ref 13.0–17.0)
MCH: 31.9 pg (ref 26.0–34.0)
MCHC: 34.9 g/dL (ref 30.0–36.0)
MCV: 91.3 fL (ref 78.0–100.0)
Platelets: 229 10*3/uL (ref 150–400)
RBC: 5.05 MIL/uL (ref 4.22–5.81)
RDW: 12.2 % (ref 11.5–15.5)
WBC: 11.4 10*3/uL — AB (ref 4.0–10.5)

## 2016-11-17 LAB — PROTIME-INR
INR: 0.95
PROTHROMBIN TIME: 12.6 s (ref 11.4–15.2)

## 2016-11-17 LAB — BASIC METABOLIC PANEL
Anion gap: 9 (ref 5–15)
BUN: 11 mg/dL (ref 6–20)
CHLORIDE: 99 mmol/L — AB (ref 101–111)
CO2: 28 mmol/L (ref 22–32)
Calcium: 9.6 mg/dL (ref 8.9–10.3)
Creatinine, Ser: 1.21 mg/dL (ref 0.61–1.24)
GFR calc Af Amer: >60 mL/min (ref >60)
GFR calc non Af Amer: >60 mL/min (ref >60)
Glucose, Bld: 93 mg/dL (ref 65–99)
POTASSIUM: 3.9 mmol/L (ref 3.5–5.1)
SODIUM: 136 mmol/L (ref 135–145)

## 2016-11-17 MED ORDER — IOPAMIDOL (ISOVUE-370) INJECTION 76%
INTRAVENOUS | Status: AC
  Administered 2016-11-17: 100 mL
  Filled 2016-11-17: qty 100

## 2016-11-17 MED ORDER — ASPIRIN 81 MG PO CHEW
324.0000 mg | CHEWABLE_TABLET | Freq: Once | ORAL | Status: AC
  Administered 2016-11-17: 324 mg via ORAL
  Filled 2016-11-17: qty 4

## 2016-11-17 MED ORDER — HEPARIN SODIUM (PORCINE) 5000 UNIT/ML IJ SOLN
4000.0000 [IU] | Freq: Once | INTRAMUSCULAR | Status: AC
  Administered 2016-11-17: 4000 [IU] via INTRAVENOUS
  Filled 2016-11-17: qty 1

## 2016-11-17 MED ORDER — DEXTROSE 5 % IV SOLN
1.0000 g | Freq: Once | INTRAVENOUS | Status: AC
  Administered 2016-11-17: 1 g via INTRAVENOUS
  Filled 2016-11-17: qty 10

## 2016-11-17 MED ORDER — LACTATED RINGERS IV BOLUS (SEPSIS)
2000.0000 mL | Freq: Once | INTRAVENOUS | Status: AC
  Administered 2016-11-17: 2000 mL via INTRAVENOUS

## 2016-11-17 MED ORDER — HEPARIN (PORCINE) IN NACL 100-0.45 UNIT/ML-% IJ SOLN
1200.0000 [IU]/h | INTRAMUSCULAR | Status: DC
  Administered 2016-11-17: 950 [IU]/h via INTRAVENOUS
  Filled 2016-11-17: qty 250

## 2016-11-17 MED ORDER — DEXTROSE 5 % IV SOLN
500.0000 mg | Freq: Once | INTRAVENOUS | Status: AC
  Administered 2016-11-17: 500 mg via INTRAVENOUS
  Filled 2016-11-17: qty 500

## 2016-11-17 MED ORDER — HEPARIN SODIUM (PORCINE) 5000 UNIT/ML IJ SOLN: 4000.0000 [IU] | Freq: Once | INTRAMUSCULAR | Status: DC

## 2016-11-17 NOTE — ED Notes (Signed)
Called main lab to add on PT-INR 

## 2016-11-17 NOTE — ED Notes (Signed)
Patient returned from CT. NAD

## 2016-11-17 NOTE — Progress Notes (Signed)
ANTICOAGULATION CONSULT NOTE - Initial Consult  Pharmacy Consult for heparin Indication: chest pain/ACS  Allergies  Allergen Reactions  . Molds & Smuts Other (See Comments)    unknown    Patient Measurements: Height: 6' (182.9 cm) Weight: 155 lb (70.3 kg) IBW/kg (Calculated) : 77.6 Heparin Dosing Weight: 70.3 kg  Vital Signs: Temp: 98.5 F (36.9 C) (09/26 1657) Temp Source: Oral (09/26 1657) BP: 114/95 (09/26 2047) Pulse Rate: 84 (09/26 2047)  Labs:  Recent Labs  11/17/16 1704  HGB 16.1  HCT 46.1  PLT 229  LABPROT 12.6  INR 0.95  CREATININE 1.21    Medical History: Past Medical History:  Diagnosis Date  . Asthma      Assessment: 31 yo male admitted s/p polysubstance abuse. Elevated troponin. Tox screen positive for bzd, opiates, cocaine, THC. Starting heparin gtt while patient is being evaluated.cbc wnl. Pt already received heparin bolus while in ED.   Goal of Therapy:  Heparin level 0.3-0.7 units/ml Monitor platelets by anticoagulation protocol: Yes     Plan:  -Heparin bolus 4000 units x1 (already given) then 950 units/hr -Daily HL, CBC -Level with am labs   Baldemar Friday 11/17/2016,8:52 PM

## 2016-11-17 NOTE — ED Provider Notes (Signed)
[facility]-EMERGENCY DEPT Provider Note   CSN: [id number] Arrival date & time: 11/17/16  1605     History   Chief Complaint Chief Complaint  Patient presents with  . Fall  . Shortness of Breath  . Chest Pain    HPI Paul Mcdowell. is a 31 y.o. male.  31 year old male history of polysubstance abuse who presents with 2 days of chest pressure. Pt states EtOH, heroin and xanax binge 2 days ago. Had onset of chest pressure. He does not remember events of past 2 days. States his father told him he fell in living room. Patient noted continued sharp and pressure substernal chest pain today radiating under R shoulder blade. Denies previous DVT or PE. No fevers. Denies IV drug use or known cocaine ingestion. Unsure if drugs may have been spiked with cocaine.  The history is provided by the patient. No language interpreter was used.    Past Medical History:  Diagnosis Date  . Asthma     Patient Active Problem List   Diagnosis Date Noted  . Elevated troponin 11/17/2016    History reviewed. No pertinent surgical history.     Home Medications    Prior to Admission medications   Medication Sig Start Date End Date Taking? Authorizing Provider  Aspirin-Acetaminophen-Caffeine (GOODY HEADACHE PO) Take 1 packet by mouth daily as needed (for headaches).   Yes [provider]  CRANBERRY PO Take 1 tablet by mouth daily.   Yes [provider]  multivitamin (ONE-A-DAY MEN'S) TABS tablet Take 1 tablet by mouth daily.   Yes [provider]  Omega-3 Fatty Acids (FISH OIL PO) Take 1 capsule by mouth daily.   Yes [provider]  ibuprofen (ADVIL,MOTRIN) 800 MG tablet Take 1 tablet (800 mg total) by mouth 3 (three) times daily. Patient not taking: Reported on 11/17/2016 06/12/13   Sunnie Nielsen, MD  oxyCODONE-acetaminophen (PERCOCET/ROXICET) 5-325 MG per tablet Take 2 tablets by mouth every 4 (four) hours as needed for severe pain. Patient not taking: Reported  on 11/17/2016 06/12/13   Sunnie Nielsen, MD    Family History No family history on file.  Social History Social History  Substance Use Topics  . Smoking status: Current Every Day Smoker    Packs/day: 1.00    Types: Cigarettes  . Smokeless tobacco: Never Used  . Alcohol use Yes     Allergies   Molds & smuts   Review of Systems Review of Systems  Constitutional: Positive for fatigue. Negative for chills and fever.  HENT: Negative for ear pain and sore throat.   Eyes: Negative for pain and visual disturbance.  Respiratory: Negative for cough and shortness of breath.   Cardiovascular: Positive for chest pain. Negative for palpitations.  Gastrointestinal: Negative for abdominal pain and vomiting.  Genitourinary: Negative for dysuria and hematuria.  Musculoskeletal: Negative for arthralgias and back pain.  Skin: Negative for color change and rash.  Neurological: Positive for syncope. Negative for seizures.  All other systems reviewed and are negative.    Physical Exam Updated Vital Signs BP (!) 140/93 (BP Location: Left Arm)   Pulse 78   Temp 98.1 F (36.7 C) (Oral)   Resp 14   Ht 6' (1.829 m)   Wt 64 kg (141 lb)   SpO2 100%   BMI 19.12 kg/m   Physical Exam  Constitutional: He appears well-developed. He appears ill.  HENT:  Head: Normocephalic and atraumatic.  Eyes: Conjunctivae are normal.  Neck: Neck supple.  Cardiovascular: Normal  rate and regular rhythm.   No murmur heard. Pulmonary/Chest: Effort normal and breath sounds normal. No respiratory distress.  Abdominal: Soft. There is no tenderness.  Musculoskeletal: He exhibits no edema.  Neurological: He is alert. No cranial nerve deficit. Coordination normal.  Moves all extremities  Skin: Skin is warm and dry.  Multiple tattoos  Nursing note and vitals reviewed.    ED Treatments / Results  Labs (all labs ordered are listed, but only abnormal results are displayed) Labs Reviewed  BASIC METABOLIC PANEL -  Abnormal; Notable for the following:       Result Value   Chloride 99 (*)    All other components within normal limits  CBC - Abnormal; Notable for the following:    WBC 11.4 (*)    All other components within normal limits  RAPID URINE DRUG SCREEN, HOSP PERFORMED - Abnormal; Notable for the following:    Opiates POSITIVE (*)    Cocaine POSITIVE (*)    Benzodiazepines POSITIVE (*)    Tetrahydrocannabinol POSITIVE (*)    All other components within normal limits  I-STAT TROPONIN, ED - Abnormal; Notable for the following:    Troponin i, poc 0.94 (*)    All other components within normal limits  I-STAT TROPONIN, ED - Abnormal; Notable for the following:    Troponin i, poc 1.14 (*)    All other components within normal limits  CULTURE, BLOOD (ROUTINE X 2)  CULTURE, BLOOD (ROUTINE X 2)  PROTIME-INR  PROTIME-INR  HEPARIN LEVEL (UNFRACTIONATED)  CBC  TROPONIN I  TROPONIN I  TROPONIN I  CK  I-STAT CG4 LACTIC ACID, ED  I-STAT CG4 LACTIC ACID, ED    EKG  EKG Interpretation  Date/Time:  Wednesday November 17 2016 17:15:30 EDT Ventricular Rate:  82 PR Interval:  126 QRS Duration: 98 QT Interval:  378 QTC Calculation: 441 R Axis:   91 Text Interpretation:  Normal sinus rhythm Right atrial enlargement Rightward axis Left ventricular hypertrophy Abnormal ECG ST elevations in V2 V3 worse since prior , ?BER vs STEMI Reconfirmed by Corlis Leak, Courteney ([id number]) on 11/17/2016 8:52:16 PM       Radiology Dg Chest 2 View  Result Date: 11/17/2016 CLINICAL DATA:  Chest pain after fall EXAM: CHEST  2 VIEW COMPARISON:  None. FINDINGS: The heart size and mediastinal contours are within normal limits. Both lungs are clear. The visualized skeletal structures are unremarkable. IMPRESSION: No active cardiopulmonary disease. Electronically Signed   By: Jasmine Pang M.D.   On: 11/17/2016 17:44   Ct Angio Chest Pe W And/or Wo Contrast  Result Date: 11/17/2016 CLINICAL DATA:  Right-sided chest pain  and dyspnea x4 days. EXAM: CT ANGIOGRAPHY CHEST WITH CONTRAST TECHNIQUE: Multidetector CT imaging of the chest was performed using the standard protocol during bolus administration of intravenous contrast. Multiplanar CT image reconstructions and MIPs were obtained to evaluate the vascular anatomy. CONTRAST:  70 cc Isovue 370 IV COMPARISON:  CXR from the same day FINDINGS: Cardiovascular: The study is of quality for the evaluation of pulmonary embolism. There are no filling defects in the central, lobar, segmental or subsegmental pulmonary artery branches to suggest acute pulmonary embolism. Great vessels are normal in course and caliber. Normal heart size. No significant pericardial fluid/thickening. Mediastinum/Nodes: No discrete thyroid nodules. Unremarkable esophagus. No pathologically enlarged axillary, mediastinal or hilar lymph nodes. Lungs/Pleura: No pneumothorax. No pleural effusion. Faint intraparenchymal pulmonary opacities in the superior segment of right lower lobe may represent a small focus of pneumonia, series 9,  image 60. Upper abdomen: Unremarkable. Musculoskeletal:  No aggressive appearing focal osseous lesions. Review of the MIP images confirms the above findings. IMPRESSION: 1. Small focus of pulmonary consolidation in the superior segment of the right lower lobe consistent pneumonia. This may explain the patient's right-sided chest pain and dyspnea. 2. No acute pulmonary embolus. Electronically Signed   By: Tollie Eth M.D.   On: 11/17/2016 21:42    Procedures Procedures (including critical care time)  Medications Ordered in ED Medications  heparin ADULT infusion 100 units/mL (25000 units/244mL sodium chloride 0.45%) (950 Units/hr Intravenous Transfusing/Transfer 11/18/16 0019)  sodium chloride 0.9 % bolus 1,000 mL (not administered)    And  0.9 %  sodium chloride infusion (not administered)  heparin injection 4,000 Units (4,000 Units Intravenous Given 11/17/16 2055)  aspirin  chewable tablet 324 mg (324 mg Oral Given 11/17/16 2059)  iopamidol (ISOVUE-370) 76 % injection (100 mLs  Contrast Given 11/17/16 2114)  cefTRIAXone (ROCEPHIN) 1 g in dextrose 5 % 50 mL IVPB (0 g Intravenous Stopped 11/17/16 2355)  azithromycin (ZITHROMAX) 500 mg in dextrose 5 % 250 mL IVPB (500 mg Intravenous Transfusing/Transfer 11/18/16 0019)  lactated ringers bolus 2,000 mL (2,000 mLs Intravenous New Bag/Given 11/17/16 2357)     Initial Impression / Assessment and Plan / ED Course  I have reviewed the triage vital signs and the nursing notes.  Pertinent labs & imaging results that were available during my care of the patient were reviewed by me and considered in my medical decision making (see chart for details).     32 yoM h/o polysubstance abuse including recent binging who p/w chest pain and found to have troponemia with concerning EKG changes. Chest pain pleuritic in nature. No focal neuro deficits. Lungs CTAB.  Initial troponin elevated to 0.94. EKG showing abnormal ST elevations of V2 and V3. CXR showing NACPA. CT PE study ordered.  Repeat troponin elevated to 1.14. Repeat EKG showing continued V2 V3 ST elevations and reciprocal depressions in inferior leads. Dr. Corlis Leak, ED attending, spoke with STEMI team. Doubt coronary occlusion or need for cath lab given age. Higher concern for illicit substance induced vasospasm. No active withdrawal symptoms.  CT PE study showing no acute PTE and small pulmonary consolidation concerning for pneumonia. Rocephin/azithro given. Pt admitted to Cardiology for further management and evaluation. Pt stable at time of transfer.  Pt care d/w Dr. Corlis Leak  Final Clinical Impressions(s) / ED Diagnoses   Final diagnoses:  Elevated troponin  Polysubstance abuse (HCC)  Coronary vasospasm [facility])    New Prescriptions Current Discharge Medication List       Hebert Soho, MD 11/18/16 0235    Abelino Derrick, MD 11/18/16 [phone]

## 2016-11-17 NOTE — ED Triage Notes (Signed)
Pt rpeorts "falling out" while using heroin and xanax on Sunday, reports R sided CP with SOB after fall, worse with movement.

## 2016-11-17 NOTE — ED Notes (Signed)
Informed first nurse RYetta Barre of troponin 0.94

## 2016-11-17 NOTE — ED Notes (Signed)
Patient transported to CT 

## 2016-11-17 NOTE — H&P (Signed)
Patient ID: Paul Mcdowell. MRN: [id number], DOB/AGE: 06/28/1985   Admit date: 11/17/2016   Primary Physician: Patient, No Pcp Per Primary Cardiologist: None  Pt. Profile: 31 yo male w/ history of ETOH, drug and tobacco use who presents with R sided chest pain, worse with palpitation.  Problem List  Past Medical History:  Diagnosis Date  . Asthma     History reviewed. No pertinent surgical history.   Allergies  Allergies  Allergen Reactions  . Molds & Smuts Shortness Of Breath and Other (See Comments)    (Mold and mildew) both FLARE THE PATIENT'S ASTHMA    HPI 31 yo male w/ history of ETOH, drug and tobacco use who presents for chest pain and weakness.  He reports that several days ago he tried IV heroin and has smoked crack cocaine maybe 1-2 days ago. He drinks at least 6-12 beers a day. For the last 24 hours he has felt more fatigued then normal, then woke up this morning and felt R sided chest pain. Of note he was sleeping for more than 12 hours and unsure of the position.   No h/o early CAD in family. No prior surgeries or heart history.   Home Medications  Prior to Admission medications   Medication Sig Start Date End Date Taking? Authorizing Provider  Aspirin-Acetaminophen-Caffeine (GOODY HEADACHE PO) Take 1 packet by mouth daily as needed (for headaches).   Yes [provider]  CRANBERRY PO Take 1 tablet by mouth daily.   Yes [provider]  multivitamin (ONE-A-DAY MEN'S) TABS tablet Take 1 tablet by mouth daily.   Yes [provider]  Omega-3 Fatty Acids (FISH OIL PO) Take 1 capsule by mouth daily.   Yes [provider]  ibuprofen (ADVIL,MOTRIN) 800 MG tablet Take 1 tablet (800 mg total) by mouth 3 (three) times daily. Patient not taking: Reported on 11/17/2016 06/12/13   Sunnie Nielsen, MD  oxyCODONE-acetaminophen (PERCOCET/ROXICET) 5-325 MG per tablet Take 2 tablets by mouth every 4 (four) hours as needed for severe  pain. Patient not taking: Reported on 11/17/2016 06/12/13   Sunnie Nielsen, MD    Family History  No family history on file.  Social History  Social History   Social History  . Marital status: Single    Spouse name: N/A  . Number of children: N/A  . Years of education: N/A   Occupational History  . Not on file.   Social History Main Topics  . Smoking status: Current Every Day Smoker    Packs/day: 1.00    Types: Cigarettes  . Smokeless tobacco: Never Used  . Alcohol use Yes  . Drug use: Yes    Frequency: 5.0 times per week    Types: Marijuana, IV, Cocaine     Comment: last used herioin and xanax on 11/14/16, states last used crack last week  . Sexual activity: Not on file   Other Topics Concern  . Not on file   Social History Narrative  . No narrative on file     Review of Systems General:  No chills, fever, night sweats or weight changes.  Cardiovascular:  +chest pain, no dyspnea on exertion, edema, orthopnea, palpitations, paroxysmal nocturnal dyspnea. Dermatological: No rash, lesions/masses Respiratory: No cough, dyspnea Urologic: No hematuria, dysuria Abdominal:   No nausea, vomiting, diarrhea, bright red blood per rectum, melena, or hematemesis Neurologic:  No visual changes, wkns, changes in mental status. All other systems reviewed and are otherwise negative except as noted above.  Physical Exam  Blood pressure 137/80, pulse 76, temperature 98.5 F (36.9 C), temperature source Oral, resp. rate 15, height 6' (1.829 m), weight 70.3 kg (155 lb), SpO2 100 %.  General: Pleasant, NAD Psych: Normal affect. Neuro: Alert and oriented X 3. Moves all extremities spontaneously. HEENT: Normal  Neck: Supple without bruits or JVD. Lungs:  Resp regular and unlabored, CTA. Heart: RRR no s3, s4, or murmurs., R sided chest pain on palpation  Abdomen: Soft, non-tender, non-distended, BS + x 4.  Extremities: No clubbing, cyanosis or edema. DP/PT/Radials 2+ and equal  bilaterally.  Labs  Troponin [facility] of Care Test)  Recent Labs  11/17/16 2029  TROPIPOC 1.14*   No results for input(s): CKTOTAL, CKMB, TROPONINI in the last 72 hours. Lab Results  Component Value Date   WBC 11.4 (H) 11/17/2016   HGB 16.1 11/17/2016   HCT 46.1 11/17/2016   MCV 91.3 11/17/2016   PLT 229 11/17/2016    Recent Labs Lab 11/17/16 1704  NA 136  K 3.9  CL 99*  CO2 28  BUN 11  CREATININE 1.21  CALCIUM 9.6  GLUCOSE 93   No results found for: CHOL, HDL, LDLCALC, TRIG No results found for: DDIMER   Radiology/Studies  Dg Chest 2 View  Result Date: 11/17/2016 CLINICAL DATA:  Chest pain after fall EXAM: CHEST  2 VIEW COMPARISON:  None. FINDINGS: The heart size and mediastinal contours are within normal limits. Both lungs are clear. The visualized skeletal structures are unremarkable. IMPRESSION: No active cardiopulmonary disease. Electronically Signed   By: Jasmine Pang M.D.   On: 11/17/2016 17:44   Ct Angio Chest Pe W And/or Wo Contrast  Result Date: 11/17/2016 CLINICAL DATA:  Right-sided chest pain and dyspnea x4 days. EXAM: CT ANGIOGRAPHY CHEST WITH CONTRAST TECHNIQUE: Multidetector CT imaging of the chest was performed using the standard protocol during bolus administration of intravenous contrast. Multiplanar CT image reconstructions and MIPs were obtained to evaluate the vascular anatomy. CONTRAST:  70 cc Isovue 370 IV COMPARISON:  CXR from the same day FINDINGS: Cardiovascular: The study is of quality for the evaluation of pulmonary embolism. There are no filling defects in the central, lobar, segmental or subsegmental pulmonary artery branches to suggest acute pulmonary embolism. Great vessels are normal in course and caliber. Normal heart size. No significant pericardial fluid/thickening. Mediastinum/Nodes: No discrete thyroid nodules. Unremarkable esophagus. No pathologically enlarged axillary, mediastinal or hilar lymph nodes. Lungs/Pleura: No pneumothorax. No  pleural effusion. Faint intraparenchymal pulmonary opacities in the superior segment of right lower lobe may represent a small focus of pneumonia, series 9, image 60. Upper abdomen: Unremarkable. Musculoskeletal:  No aggressive appearing focal osseous lesions. Review of the MIP images confirms the above findings. IMPRESSION: 1. Small focus of pulmonary consolidation in the superior segment of the right lower lobe consistent pneumonia. This may explain the patient's right-sided chest pain and dyspnea. 2. No acute pulmonary embolus. Electronically Signed   By: Tollie Eth M.D.   On: 11/17/2016 21:42    ECG Some T wave inversions inferiorly   Echocardiogram  None    ASSESSMENT AND PLAN 31 yo male w/ history of ETOH, drug and tobacco use who presents with R sided chest pain and troponinemia.   # Troponin elevation: possibly related to cocaine vasospasm vs demand ischemia in setting of heavy drug use. He says crack, heroin and etoh recently. On exam, chest pain significantly worse with palpation.  - troponin q 6hrs - f/u CK - ok to continue heparin  from ED however will be able to stop in AM - control BP  # Drug and ETOH use: never had withdrawal symptoms from ETOH or seizures.  - monitor  - IVF  FULL CODE PPx early ambulation  Signed, Yehuda Savannah, MD

## 2016-11-17 NOTE — ED Notes (Signed)
Trop =0.94. Dr. Corlis Leak Notified - Patient to be roomed NEXT.

## 2016-11-18 DIAGNOSIS — W19XXXD Unspecified fall, subsequent encounter: Secondary | ICD-10-CM

## 2016-11-18 DIAGNOSIS — F1490 Cocaine use, unspecified, uncomplicated: Secondary | ICD-10-CM

## 2016-11-18 DIAGNOSIS — T796XXA Traumatic ischemia of muscle, initial encounter: Secondary | ICD-10-CM

## 2016-11-18 DIAGNOSIS — I999 Unspecified disorder of circulatory system: Secondary | ICD-10-CM

## 2016-11-18 DIAGNOSIS — F14988 Cocaine use, unspecified with other cocaine-induced disorder: Secondary | ICD-10-CM

## 2016-11-18 DIAGNOSIS — R748 Abnormal levels of other serum enzymes: Secondary | ICD-10-CM

## 2016-11-18 LAB — CBC
HCT: 40.2 % (ref 39.0–52.0)
HEMOGLOBIN: 13.8 g/dL (ref 13.0–17.0)
MCH: 31.1 pg (ref 26.0–34.0)
MCHC: 34.3 g/dL (ref 30.0–36.0)
MCV: 90.5 fL (ref 78.0–100.0)
PLATELETS: 208 10*3/uL (ref 150–400)
RBC: 4.44 MIL/uL (ref 4.22–5.81)
RDW: 12.3 % (ref 11.5–15.5)
WBC: 10.1 10*3/uL (ref 4.0–10.5)

## 2016-11-18 LAB — PROTIME-INR
INR: 1.03
Prothrombin Time: 13.4 seconds (ref 11.4–15.2)

## 2016-11-18 LAB — TROPONIN I
TROPONIN I: 1.28 ng/mL — AB (ref <0.03)
TROPONIN I: 0.42 ng/mL — AB (ref <0.03)
Troponin I: 1.01 ng/mL (ref <0.03)

## 2016-11-18 LAB — CK: CK TOTAL: 13407 U/L — AB (ref 49–397)

## 2016-11-18 LAB — HEPARIN LEVEL (UNFRACTIONATED): HEPARIN UNFRACTIONATED: 0.12 [IU]/mL — AB (ref 0.30–0.70)

## 2016-11-18 MED ORDER — ACETAMINOPHEN 325 MG PO TABS
650.0000 mg | ORAL_TABLET | Freq: Three times a day (TID) | ORAL | Status: DC | PRN
  Administered 2016-11-18: 650 mg via ORAL
  Filled 2016-11-18: qty 2

## 2016-11-18 MED ORDER — THIAMINE HCL 100 MG/ML IJ SOLN: 100.0000 mg | Freq: Every day | INTRAMUSCULAR | Status: DC

## 2016-11-18 MED ORDER — LORAZEPAM 1 MG PO TABS: 1.0000 mg | ORAL_TABLET | Freq: Four times a day (QID) | ORAL | Status: DC | PRN

## 2016-11-18 MED ORDER — ZOLPIDEM TARTRATE 5 MG PO TABS
5.0000 mg | ORAL_TABLET | Freq: Every evening | ORAL | Status: DC | PRN
  Administered 2016-11-19: 5 mg via ORAL
  Filled 2016-11-18: qty 1

## 2016-11-18 MED ORDER — SODIUM CHLORIDE 0.9 % IV SOLN
INTRAVENOUS | Status: DC
  Administered 2016-11-18 (×2): via INTRAVENOUS

## 2016-11-18 MED ORDER — VITAMIN B-1 100 MG PO TABS
100.0000 mg | ORAL_TABLET | Freq: Every day | ORAL | Status: DC
  Administered 2016-11-18 – 2016-11-19 (×2): 100 mg via ORAL
  Filled 2016-11-18 (×2): qty 1

## 2016-11-18 MED ORDER — COLCHICINE 0.6 MG PO TABS
0.6000 mg | ORAL_TABLET | Freq: Two times a day (BID) | ORAL | Status: DC
  Administered 2016-11-18 – 2016-11-19 (×2): 0.6 mg via ORAL
  Filled 2016-11-18 (×2): qty 1

## 2016-11-18 MED ORDER — SODIUM CHLORIDE 0.9 % IV BOLUS (SEPSIS)
1000.0000 mL | Freq: Once | INTRAVENOUS | Status: AC
  Administered 2016-11-18: 1000 mL via INTRAVENOUS

## 2016-11-18 MED ORDER — LORAZEPAM 1 MG PO TABS
0.0000 mg | ORAL_TABLET | Freq: Four times a day (QID) | ORAL | Status: DC
  Administered 2016-11-19: 1 mg via ORAL
  Filled 2016-11-18: qty 1

## 2016-11-18 MED ORDER — ENOXAPARIN SODIUM 40 MG/0.4ML ~~LOC~~ SOLN
40.0000 mg | SUBCUTANEOUS | Status: DC
  Administered 2016-11-18: 40 mg via SUBCUTANEOUS
  Filled 2016-11-18: qty 0.4

## 2016-11-18 MED ORDER — LORAZEPAM 1 MG PO TABS: 0.0000 mg | ORAL_TABLET | Freq: Two times a day (BID) | ORAL | Status: DC

## 2016-11-18 MED ORDER — FOLIC ACID 1 MG PO TABS
1.0000 mg | ORAL_TABLET | Freq: Every day | ORAL | Status: DC
  Administered 2016-11-18 – 2016-11-19 (×2): 1 mg via ORAL
  Filled 2016-11-18 (×2): qty 1

## 2016-11-18 MED ORDER — ADULT MULTIVITAMIN W/MINERALS CH
1.0000 | ORAL_TABLET | Freq: Every day | ORAL | Status: DC
  Administered 2016-11-18 – 2016-11-19 (×2): 1 via ORAL
  Filled 2016-11-18 (×2): qty 1

## 2016-11-18 MED ORDER — ALBUTEROL SULFATE (2.5 MG/3ML) 0.083% IN NEBU
2.5000 mg | INHALATION_SOLUTION | Freq: Four times a day (QID) | RESPIRATORY_TRACT | Status: DC | PRN
  Administered 2016-11-18 – 2016-11-19 (×2): 2.5 mg via RESPIRATORY_TRACT
  Filled 2016-11-18 (×2): qty 3

## 2016-11-18 MED ORDER — LORAZEPAM 2 MG/ML IJ SOLN: 1.0000 mg | Freq: Four times a day (QID) | INTRAMUSCULAR | Status: DC | PRN

## 2016-11-18 MED ORDER — HEPARIN BOLUS VIA INFUSION
2000.0000 [IU] | Freq: Once | INTRAVENOUS | Status: AC
  Administered 2016-11-18: 2000 [IU] via INTRAVENOUS
  Filled 2016-11-18: qty 2000

## 2016-11-18 NOTE — Progress Notes (Signed)
ANTICOAGULATION CONSULT NOTE Pharmacy Consult for heparin Indication: chest pain/ACS  Allergies  Allergen Reactions  . Molds & Smuts Shortness Of Breath and Other (See Comments)    (Mold and mildew) both FLARE THE PATIENT'S ASTHMA    Patient Measurements: Height: 6' (182.9 cm) Weight: 141 lb (64 kg) IBW/kg (Calculated) : 77.6 Heparin Dosing Weight: 70.3 kg  Vital Signs: Temp: 98.1 F (36.7 C) (09/27 0035) Temp Source: Oral (09/27 0035) BP: 140/93 (09/27 0035) Pulse Rate: 78 (09/27 0035)  Labs:  Recent Labs  11/17/16 1704 11/18/16 0239  HGB 16.1  --   HCT 46.1  --   PLT 229  --   LABPROT 12.6 13.4  INR 0.95 1.03  HEPARINUNFRC  --  0.12*  CREATININE 1.21  --    Assessment: 31 yo male with elevated troponin for heparin  Goal of Therapy:  Heparin level 0.3-0.7 units/ml Monitor platelets by anticoagulation protocol: Yes   Plan:  Heparin 2000 units IV bolus, then increase heparin 1200 units/hr Check heparin level in 6 hours.   Alvin Rubano, Gary Fleet 11/18/2016,3:56 AM

## 2016-11-18 NOTE — Progress Notes (Signed)
Progress Note  Patient Name: Paul Mcdowell. Date of Encounter: 11/18/2016  Primary Cardiologist: New patient  Subjective   The patient complains of chest pain on deep inspiration and with right arm motion.   Inpatient Medications    Scheduled Meds:  Continuous Infusions: . sodium chloride 125 mL/hr at 11/18/16 0100  . heparin 1,200 Units/hr (11/18/16 0406)   PRN Meds:   Vital Signs    Vitals:   11/17/16 2047 11/17/16 2100 11/18/16 0000 11/18/16 0035  BP: (!) 114/95 137/80  (!) 140/93  Pulse: 84 76 74 78  Resp: (!) Temp:    98.1 F (36.7 C)  TempSrc:    Oral  SpO2: 99% 100% 100% 100%  Weight:    141 lb (64 kg)  Height:    6' (1.829 m)    Intake/Output Summary (Last 24 hours) at 11/18/16 1115 Last data filed at 11/18/16 0300  Gross per 24 hour  Intake          1982.43 ml  Output                0 ml  Net          1982.43 ml   Filed Weights   11/17/16 1659 11/18/16 0035  Weight: 155 lb (70.3 kg) 141 lb (64 kg)   Telemetry    SR - Personally Reviewed  ECG    SR, LVH - Personally Reviewed  Physical Exam  Seems uncomfortable, restless GEN: No acute distress.   Neck: No JVD Cardiac: RRR, no murmurs, rubs, or gallops.  Respiratory: Clear to auscultation bilaterally. GI: Soft, nontender, non-distended  MS: No edema; No deformity. Neuro:  Nonfocal  Psych: Normal affect   Labs    Chemistry Recent Labs Lab 11/17/16 1704  NA 136  K 3.9  CL 99*  CO2 28  GLUCOSE 93  BUN 11  CREATININE 1.21  CALCIUM 9.6  GFRNONAA >60  GFRAA >60  ANIONGAP 9     Hematology Recent Labs Lab 11/17/16 1704 11/18/16 0605  WBC 11.4* 10.1  RBC 5.05 4.44  HGB 16.1 13.8  HCT 46.1 40.2  MCV 91.3 90.5  MCH 31.9 31.1  MCHC 34.9 34.3  RDW 12.2 12.3  PLT 229 208    Cardiac Enzymes Recent Labs Lab 11/18/16 0239 11/18/16 0605  TROPONINI 1.28* 1.01*    Recent Labs Lab 11/17/16 1725 11/17/16 2029  TROPIPOC 0.94* 1.14*     BNPNo  results for input(s): BNP, PROBNP in the last 168 hours.   DDimer No results for input(s): DDIMER in the last 168 hours.   Radiology    Dg Chest 2 View  Result Date: 11/17/2016 CLINICAL DATA:  Chest pain after fall EXAM: CHEST  2 VIEW COMPARISON:  None. FINDINGS: The heart size and mediastinal contours are within normal limits. Both lungs are clear. The visualized skeletal structures are unremarkable. IMPRESSION: No active cardiopulmonary disease. Electronically Signed   By: Jasmine Pang M.D.   On: 11/17/2016 17:44   Ct Angio Chest Pe W And/or Wo Contrast  Result Date: 11/17/2016 CLINICAL DATA:  Right-sided chest pain and dyspnea x4 days. EXAM: CT ANGIOGRAPHY CHEST WITH CONTRAST TECHNIQUE: Multidetector CT imaging of the chest was performed using the standard protocol during bolus administration of intravenous contrast. Multiplanar CT image reconstructions and MIPs were obtained to evaluate the vascular anatomy. CONTRAST:  70 cc Isovue 370 IV COMPARISON:  CXR from the same day FINDINGS: Cardiovascular: The study is  of quality for the evaluation of pulmonary embolism. There are no filling defects in the central, lobar, segmental or subsegmental pulmonary artery branches to suggest acute pulmonary embolism. Great vessels are normal in course and caliber. Normal heart size. No significant pericardial fluid/thickening. Mediastinum/Nodes: No discrete thyroid nodules. Unremarkable esophagus. No pathologically enlarged axillary, mediastinal or hilar lymph nodes. Lungs/Pleura: No pneumothorax. No pleural effusion. Faint intraparenchymal pulmonary opacities in the superior segment of right lower lobe may represent a small focus of pneumonia, series 9, image 60. Upper abdomen: Unremarkable. Musculoskeletal:  No aggressive appearing focal osseous lesions. Review of the MIP images confirms the above findings. IMPRESSION: 1. Small focus of pulmonary consolidation in the superior segment of the right lower lobe  consistent pneumonia. This may explain the patient's right-sided chest pain and dyspnea. 2. No acute pulmonary embolus. Electronically Signed   By: Tollie Eth M.D.   On: 11/17/2016 21:42   Cardiac Studies      Patient Profile     31 y.o. male w/ history of ETOH, drug and tobacco use who presents with R sided chest pain and troponinemia.   Assessment & Plan    # Troponin elevation: possibly related to cocaine vasospasm vs demand ischemia in setting of heavy drug use. He says crack, heroin, marijuana and etoh recently. On exam, chest pain significantly worse with palpation.  - Troponin 1.28 -> 1.0 - CK 13.400, sec to fall 2 days ago - ok to stop heparin - control BP - echo is pending - also pleuritic component, I will start colchicine 0.6 mg po BID - unless significant LV wall motion abnormalities we will not plan for an ischemic workup, troponin elevated sec to fall with severe CPK elevation and a possible vasospasm with cocaine use - I will start iv fluid to prevent rhabdomyalysis  # Drug and ETOH use: never had withdrawal symptoms from ETOH or seizures.  - monitor  - IVF  For questions or updates, please contact [facility] Please consult [email or web address] for contact info under Cardiology/STEMI.      Signed, Tobias Alexander, MD  11/18/2016, 11:15 AM

## 2016-11-18 NOTE — Care Management Note (Addendum)
Case Management Note  Patient Details  Name: Paul Mcdowell. MRN: [id number] Date of Birth: 01/13/1986  Subjective/Objective: Pt presented for Increased Troponin- Chest Pain. Hx ETOH-No Insurance and no PCP.                    Action/Plan: CM did speak with pt agreeable to allow CM to call the Clinics to see if an appointment available for Hospital f/u. CM did call the [facility]- Appointment Scheduled and placed on the AVS. Pt will be able to utilize the [facility] for medication assistance. Pt may need the [facility] if d/c on Colchicine.   Expected Discharge Date:                  Expected Discharge Plan:  Home/Self Care  In-House Referral:  Financial Counselor  Discharge planning Services  CM Consult, Follow-up appt scheduled, Indigent Health Clinic, Medication Assistance  Post Acute Care Choice:  NA Choice offered to:  NA  DME Arranged:  N/A DME Agency:  NA  HH Arranged:  NA HH Agency:  NA  Status of Service:  Completed, signed off  If discussed at Long Length of Stay Meetings, dates discussed:    Additional Comments: 1538 11-19-16 Tomi Bamberger, RN,BSN [phone] No Colchicine for patient at d/c per MD. Plan for Augmentin po at d/c. MATCH Letter not needed at this time. Pt may use any local Pharmacy if he can't make it to the [facility] before Pharmacy closing at 4:30. CM did make pt aware that he can go to the [facility] as well- and the medication will be cheap. Pharmacy closes at 6:00 pm. No further needs from CM.  Gala Lewandowsky, RN 11/18/2016, 11:40 AM

## 2016-11-19 ENCOUNTER — Inpatient Hospital Stay (HOSPITAL_COMMUNITY): Payer: Self-pay

## 2016-11-19 ENCOUNTER — Encounter (HOSPITAL_COMMUNITY): Payer: Self-pay | Admitting: Cardiology

## 2016-11-19 DIAGNOSIS — R079 Chest pain, unspecified: Secondary | ICD-10-CM

## 2016-11-19 DIAGNOSIS — F1410 Cocaine abuse, uncomplicated: Secondary | ICD-10-CM

## 2016-11-19 DIAGNOSIS — I519 Heart disease, unspecified: Secondary | ICD-10-CM | POA: Diagnosis present

## 2016-11-19 DIAGNOSIS — F1110 Opioid abuse, uncomplicated: Secondary | ICD-10-CM

## 2016-11-19 DIAGNOSIS — J690 Pneumonitis due to inhalation of food and vomit: Secondary | ICD-10-CM

## 2016-11-19 DIAGNOSIS — J189 Pneumonia, unspecified organism: Secondary | ICD-10-CM

## 2016-11-19 DIAGNOSIS — Q2381 Bicuspid aortic valve: Secondary | ICD-10-CM

## 2016-11-19 DIAGNOSIS — Q231 Congenital insufficiency of aortic valve: Secondary | ICD-10-CM

## 2016-11-19 DIAGNOSIS — I351 Nonrheumatic aortic (valve) insufficiency: Secondary | ICD-10-CM

## 2016-11-19 DIAGNOSIS — F1910 Other psychoactive substance abuse, uncomplicated: Secondary | ICD-10-CM

## 2016-11-19 HISTORY — DX: Chest pain, unspecified: R07.9

## 2016-11-19 HISTORY — DX: Other psychoactive substance abuse, uncomplicated: F19.10

## 2016-11-19 HISTORY — DX: Congenital insufficiency of aortic valve: Q23.1

## 2016-11-19 HISTORY — DX: Pneumonia, unspecified organism: J18.9

## 2016-11-19 HISTORY — DX: Heart disease, unspecified: I51.9

## 2016-11-19 HISTORY — DX: Bicuspid aortic valve: Q23.81

## 2016-11-19 LAB — ECHOCARDIOGRAM COMPLETE
HEIGHTINCHES: 72 in
Weight: 2321.6 oz

## 2016-11-19 LAB — CBC
HEMATOCRIT: 39.4 % (ref 39.0–52.0)
HEMOGLOBIN: 13.4 g/dL (ref 13.0–17.0)
MCH: 30.9 pg (ref 26.0–34.0)
MCHC: 34 g/dL (ref 30.0–36.0)
MCV: 91 fL (ref 78.0–100.0)
Platelets: 205 10*3/uL (ref 150–400)
RBC: 4.33 MIL/uL (ref 4.22–5.81)
RDW: 12 % (ref 11.5–15.5)
WBC: 9.1 10*3/uL (ref 4.0–10.5)

## 2016-11-19 MED ORDER — LOSARTAN POTASSIUM 25 MG PO TABS: 25.0000 mg | ORAL_TABLET | Freq: Every day | ORAL | Status: DC

## 2016-11-19 MED ORDER — ACETAMINOPHEN 325 MG PO TABS: 650.0000 mg | ORAL_TABLET | Freq: Three times a day (TID) | ORAL | Status: DC | PRN

## 2016-11-19 MED ORDER — AZITHROMYCIN 250 MG PO TABS
250.0000 mg | ORAL_TABLET | Freq: Every day | ORAL | Status: DC
Start: 2016-11-20 — End: 2016-11-19

## 2016-11-19 MED ORDER — AMOXICILLIN-POT CLAVULANATE 875-125 MG PO TABS: 1.0000 | ORAL_TABLET | Freq: Two times a day (BID) | ORAL | 0 refills | Status: DC

## 2016-11-19 MED ORDER — AZITHROMYCIN 250 MG PO TABS
500.0000 mg | ORAL_TABLET | Freq: Every day | ORAL | Status: AC
  Administered 2016-11-19: 500 mg via ORAL
  Filled 2016-11-19: qty 2

## 2016-11-19 MED ORDER — FOLIC ACID 1 MG PO TABS
1.0000 mg | ORAL_TABLET | Freq: Every day | ORAL | 1 refills | Status: DC
Start: 2016-11-20 — End: 2016-12-07

## 2016-11-19 MED ORDER — THIAMINE HCL 100 MG PO TABS: 100.0000 mg | ORAL_TABLET | Freq: Every day | ORAL | 1 refills | Status: DC

## 2016-11-19 MED ORDER — DEXTROSE 5 % IV SOLN
1.0000 g | INTRAVENOUS | Status: DC
  Administered 2016-11-19: 1 g via INTRAVENOUS
  Filled 2016-11-19: qty 10

## 2016-11-19 MED ORDER — AMOXICILLIN-POT CLAVULANATE 875-125 MG PO TABS: 1.0000 | ORAL_TABLET | Freq: Two times a day (BID) | ORAL | Status: DC

## 2016-11-19 MED ORDER — LOSARTAN POTASSIUM 25 MG PO TABS: 25.0000 mg | ORAL_TABLET | Freq: Every day | ORAL | 6 refills | Status: DC

## 2016-11-19 NOTE — Discharge Instructions (Signed)
No drug use, alcohol or tobacco use.    Take medications as ordered.   Keep follow up appointment   Heart Healthy Diet

## 2016-11-19 NOTE — Progress Notes (Signed)
Patient given discharge instructions with dad at bedside. Educated on antibiotics and BP medication. Education on aortic insufficiency d/t bisuspid aortic valve. Patient verbalized understanding. Offered smoking cessation information. Patient had no further questions. Both PIV removed.

## 2016-11-19 NOTE — Discharge Summary (Signed)
Discharge Summary    Patient ID: Paul Mcdowell.,  MRN: [id number], DOB/AGE: 08/27/1985 31 y.o.  Admit date: 11/17/2016 Discharge date: 11/19/2016  Primary Care Provider: Patient, No Pcp Per Primary Cardiologist: Dr. Delton See  Discharge Diagnoses    Principal Problem:   Chest pain Active Problems:   Elevated troponin   Bicuspid aortic valve   Aortic insufficiency due to bicuspid aortic valve   Pneumonia   Substance abuse (HCC)   LV dysfunction   Allergies Allergies  Allergen Reactions  . Molds & Smuts Shortness Of Breath and Other (See Comments)    (Mold and mildew) both FLARE THE PATIENT'S ASTHMA    Diagnostic Studies/Procedures    Echo 11/19/16  Study Conclusions  - Left ventricle: The cavity size was mildly dilated. Wall   thickness was normal. Systolic function was mildly reduced. The   estimated ejection fraction was in the range of 45% to 50%. Left   ventricular diastolic function parameters were normal. - Aortic valve: Possibly bicuspid; mildly thickened leaflets. There   was moderate to severe regurgitation directed eccentrically in   the LVOT and towards the mitral anterior leaflet. Severe   regurgitation is suggested by holodiastolic flow reversal in the   descending aorta. Valve area (VTI): 4.18 cm^2.  Impressions:  - Suspect aortic insufficiency is related to a congenitally   bicuspid aortic valve. Recommend TEE for confirmation. Consider   CT angiography of the thoracic aorta if the clinical presentation   is consistent with aortic dissection.  _____________   History of Present Illness     31 yo male w/ history of ETOH, drug and tobacco use who presents with R sided chest pain, worse with palpitation.  He has had years of crack cocaine use.  Also with ETOH and recently tried IV heroin. He woke 11/17/16 with Rt sides chest pain.  No early FH of CAD.  He did fall on Sunday with the drug use.   He was also "out" due to the drugs.    In  the ER his troponin was positive with peak at 1.01.  He was admitted to evaluate.  He was started on IV heparin.    Hospital Course     Consultants: none   Pt had cardiac CT neg for dissection and no PE.  It was noted to have Rt LL consistent with pneumonia.  He was started on antibiotics.  For his substance abuse he was placed on ativan for withdrawal but only took one dose.  Colchicine was added for pain but with dx of PNA this was stopped.    Echo revealed decreased EF and bicuspid aortic valve with aortic insuff.  The plan is for pt to follow up after ABX and then plan TEE.  I discussed this with the pt.    We also discussed importance of stopping drugs and ETOH and tobacco.  I offered to assist with drug rehab program but he refused stating he is is going cold [place].   Instructed to go to ER if he can not do this or call behavioral health.    His BP was elevated and losartan was started.  _____________  Discharge Vitals Blood pressure (!) 153/81, pulse 82, temperature 98 F (36.7 C), temperature source Oral, resp. rate 20, height 6' (1.829 m), weight 145 lb 1.6 oz (65.8 kg), SpO2 100 %.  Filed Weights   11/17/16 1659 11/18/16 0035 11/19/16 0632  Weight: 155 lb (70.3 kg) 141 lb (64 kg)  145 lb 1.6 oz (65.8 kg)    Labs & Radiologic Studies    CBC  Recent Labs  11/18/16 0605 11/19/16 0421  WBC 10.1 9.1  HGB 13.8 13.4  HCT 40.2 39.4  MCV 90.5 91.0  PLT 208 205   Basic Metabolic Panel  Recent Labs  11/17/16 1704  NA 136  K 3.9  CL 99*  CO2 28  GLUCOSE 93  BUN 11  CREATININE 1.21  CALCIUM 9.6   Liver Function Tests No results for input(s): AST, ALT, ALKPHOS, BILITOT, PROT, ALBUMIN in the last 72 hours. No results for input(s): LIPASE, AMYLASE in the last 72 hours. Cardiac Enzymes  Recent Labs  11/18/16 0239 11/18/16 0605 11/18/16 1513  CKTOTAL 13,407*  --   --   TROPONINI 1.28* 1.01* 0.42*   BNP Invalid input(s): POCBNP D-Dimer No results for  input(s): DDIMER in the last 72 hours. Hemoglobin A1C No results for input(s): HGBA1C in the last 72 hours. Fasting Lipid Panel No results for input(s): CHOL, HDL, LDLCALC, TRIG, CHOLHDL, LDLDIRECT in the last 72 hours. Thyroid Function Tests No results for input(s): TSH, T4TOTAL, T3FREE, THYROIDAB in the last 72 hours.  Invalid input(s): FREET3 _____________  Dg Chest 2 View  Result Date: 11/17/2016 CLINICAL DATA:  Chest pain after fall EXAM: CHEST  2 VIEW COMPARISON:  None. FINDINGS: The heart size and mediastinal contours are within normal limits. Both lungs are clear. The visualized skeletal structures are unremarkable. IMPRESSION: No active cardiopulmonary disease. Electronically Signed   By: Jasmine Pang M.D.   On: 11/17/2016 17:44   Ct Angio Chest Pe W And/or Wo Contrast  Result Date: 11/17/2016 CLINICAL DATA:  Right-sided chest pain and dyspnea x4 days. EXAM: CT ANGIOGRAPHY CHEST WITH CONTRAST TECHNIQUE: Multidetector CT imaging of the chest was performed using the standard protocol during bolus administration of intravenous contrast. Multiplanar CT image reconstructions and MIPs were obtained to evaluate the vascular anatomy. CONTRAST:  70 cc Isovue 370 IV COMPARISON:  CXR from the same day FINDINGS: Cardiovascular: The study is of quality for the evaluation of pulmonary embolism. There are no filling defects in the central, lobar, segmental or subsegmental pulmonary artery branches to suggest acute pulmonary embolism. Great vessels are normal in course and caliber. Normal heart size. No significant pericardial fluid/thickening. Mediastinum/Nodes: No discrete thyroid nodules. Unremarkable esophagus. No pathologically enlarged axillary, mediastinal or hilar lymph nodes. Lungs/Pleura: No pneumothorax. No pleural effusion. Faint intraparenchymal pulmonary opacities in the superior segment of right lower lobe may represent a small focus of pneumonia, series 9, image 60. Upper abdomen:  Unremarkable. Musculoskeletal:  No aggressive appearing focal osseous lesions. Review of the MIP images confirms the above findings. IMPRESSION: 1. Small focus of pulmonary consolidation in the superior segment of the right lower lobe consistent pneumonia. This may explain the patient's right-sided chest pain and dyspnea. 2. No acute pulmonary embolus. Electronically Signed   By: Tollie Eth M.D.   On: 11/17/2016 21:42   Disposition   Pt is being discharged home today in good condition.  Follow-up Plans & Appointments  No drug use, alcohol or tobacco use.    Take medications as ordered.   Keep follow up appointment   Heart Healthy Diet    Follow-up Information    [facility] Follow up on 12/15/2016.   Why:  @ 1:00 pm for hospital follow up with Julianne Handler, NP Contact information: [address] [place] [address]  [facility] Follow up.   Why:  Please use this location for medication assistance- cost ranges from $4.00-$10.00.  Contact informatio[address]n: [address] [place] [address] [phone]       Lars Masson, MD Follow up on 12/07/2016.   Specialty:  Cardiology Why:   at 1:30 pm with Boyce Medici, PA for Dr. Malena Peer information: [address] [place] [address] [phone]            Discharge Medications   Current Discharge Medication List    START taking these medications   Details  acetaminophen (TYLENOL) 325 MG tablet Take 2 tablets (650 mg total) by mouth every 8 (eight) hours as needed for mild pain.    amoxicillin-clavulanate (AUGMENTIN) 875-125 MG tablet Take 1 tablet by mouth every 12 (twelve) hours. Qty: 14 tablet, Refills: 0    folic acid (FOLVITE) 1 MG tablet Take 1 tablet (1 mg total) by mouth daily. Qty: 30 tablet, Refills: 1    losartan (COZAAR) 25 MG tablet Take 1 tablet (25 mg total) by mouth  daily. Qty: 30 tablet, Refills: 6    thiamine 100 MG tablet Take 1 tablet (100 mg total) by mouth daily. Qty: 30 tablet, Refills: 1      CONTINUE these medications which have NOT CHANGED   Details  CRANBERRY PO Take 1 tablet by mouth daily.    multivitamin (ONE-A-DAY MEN'S) TABS tablet Take 1 tablet by mouth daily.    Omega-3 Fatty Acids (FISH OIL PO) Take 1 capsule by mouth daily.    ibuprofen (ADVIL,MOTRIN) 800 MG tablet Take 1 tablet (800 mg total) by mouth 3 (three) times daily. Qty: 21 tablet, Refills: 0      STOP taking these medications     Aspirin-Acetaminophen-Caffeine (GOODY HEADACHE PO)      oxyCODONE-acetaminophen (PERCOCET/ROXICET) 5-325 MG per tablet            Outstanding Labs/Studies   BMP, on visit and please arrange TEE   Duration of Discharge Encounter   Greater than 30 minutes including physician time.  Signed, Nada Boozer NP 11/19/2016, 6:30 PM

## 2016-11-19 NOTE — Progress Notes (Signed)
  Echocardiogram 2D Echocardiogram has been performed.  Paul Mcdowell 11/19/2016, 3:01 PM

## 2016-11-19 NOTE — Plan of Care (Signed)
Problem: Safety: Goal: Ability to remain free from injury will improve Outcome: Progressing Patient with CIWA of 0. Bed alarm on. Call bell within reach.

## 2016-11-19 NOTE — Progress Notes (Addendum)
Progress Note  Patient Name: Paul Mcdowell. Date of Encounter: 11/19/2016  Primary Cardiologist: New  Subjective   Still with chest pain, Lt upper chest no nausea, increases with deep breath  Inpatient Medications    Scheduled Meds: . colchicine  0.6 mg Oral BID  . enoxaparin (LOVENOX) injection  40 mg Subcutaneous Q24H  . folic acid  1 mg Oral Daily  . LORazepam  0-4 mg Oral Q6H   Followed by  . [START ON 11/20/2016] LORazepam  0-4 mg Oral Q12H  . multivitamin with minerals  1 tablet Oral Daily  . thiamine  100 mg Oral Daily   Or  . thiamine  100 mg Intravenous Daily   Continuous Infusions: . sodium chloride 125 mL/hr at 11/18/16 2212   PRN Meds: acetaminophen, albuterol, LORazepam **OR** LORazepam, zolpidem   Vital Signs    Vitals:   11/18/16 1700 11/18/16 2105 11/19/16 0000 11/19/16 0632  BP:  (!) 149/82 130/78 (!) 153/98  Pulse:  61 84 62  Resp: 20 20    Temp: 97.8 F (36.6 C) 99.5 F (37.5 C)  98.5 F (36.9 C)  TempSrc: Oral Oral  Oral  SpO2: 98% 100%  99%  Weight:    145 lb 1.6 oz (65.8 kg)  Height:        Intake/Output Summary (Last 24 hours) at 11/19/16 0825 Last data filed at 11/19/16 4098  Gross per 24 hour  Intake             2820 ml  Output             3900 ml  Net            -1080 ml   Filed Weights   11/17/16 1659 11/18/16 0035 11/19/16 1191  Weight: 155 lb (70.3 kg) 141 lb (64 kg) 145 lb 1.6 oz (65.8 kg)    Telemetry    SR - Personally Reviewed  ECG    No new - Personally Reviewed  Physical Exam   GEN: No acute distress.   Neck: No JVD Cardiac: RRR, 3/6 diastolic murmur, rubs, or gallops. + extreme tenderness to Lt upper chest to palpation  Respiratory: Clear to auscultation bilaterally. GI: Soft, nontender, non-distended  MS: No edema; No deformity. Neuro:  Nonfocal  Psych: Normal affect   Labs    Chemistry Recent Labs Lab 11/17/16 1704  NA 136  K 3.9  CL 99*  CO2 28  GLUCOSE 93  BUN 11  CREATININE  1.21  CALCIUM 9.6  GFRNONAA >60  GFRAA >60  ANIONGAP 9     Hematology Recent Labs Lab 11/17/16 1704 11/18/16 0605 11/19/16 0421  WBC 11.4* 10.1 9.1  RBC 5.05 4.44 4.33  HGB 16.1 13.8 13.4  HCT 46.1 40.2 39.4  MCV 91.3 90.5 91.0  MCH 31.9 31.1 30.9  MCHC 34.9 34.3 34.0  RDW 12.2 12.3 12.0  PLT 229 208 205    Cardiac Enzymes Recent Labs Lab 11/18/16 0239 11/18/16 0605 11/18/16 1513  TROPONINI 1.28* 1.01* 0.42*    Recent Labs Lab 11/17/16 1725 11/17/16 2029  TROPIPOC 0.94* 1.14*     BNPNo results for input(s): BNP, PROBNP in the last 168 hours.   DDimer No results for input(s): DDIMER in the last 168 hours.   Radiology    Dg Chest 2 View  Result Date: 11/17/2016 CLINICAL DATA:  Chest pain after fall EXAM: CHEST  2 VIEW COMPARISON:  None. FINDINGS: The heart size and mediastinal contours are  within normal limits. Both lungs are clear. The visualized skeletal structures are unremarkable. IMPRESSION: No active cardiopulmonary disease. Electronically Signed   By: Jasmine Pang M.D.   On: 11/17/2016 17:44   Ct Angio Chest Pe W And/or Wo Contrast  Result Date: 11/17/2016 CLINICAL DATA:  Right-sided chest pain and dyspnea x4 days. EXAM: CT ANGIOGRAPHY CHEST WITH CONTRAST TECHNIQUE: Multidetector CT imaging of the chest was performed using the standard protocol during bolus administration of intravenous contrast. Multiplanar CT image reconstructions and MIPs were obtained to evaluate the vascular anatomy. CONTRAST:  70 cc Isovue 370 IV COMPARISON:  CXR from the same day FINDINGS: Cardiovascular: The study is of quality for the evaluation of pulmonary embolism. There are no filling defects in the central, lobar, segmental or subsegmental pulmonary artery branches to suggest acute pulmonary embolism. Great vessels are normal in course and caliber. Normal heart size. No significant pericardial fluid/thickening. Mediastinum/Nodes: No discrete thyroid nodules. Unremarkable  esophagus. No pathologically enlarged axillary, mediastinal or hilar lymph nodes. Lungs/Pleura: No pneumothorax. No pleural effusion. Faint intraparenchymal pulmonary opacities in the superior segment of right lower lobe may represent a small focus of pneumonia, series 9, image 60. Upper abdomen: Unremarkable. Musculoskeletal:  No aggressive appearing focal osseous lesions. Review of the MIP images confirms the above findings. IMPRESSION: 1. Small focus of pulmonary consolidation in the superior segment of the right lower lobe consistent pneumonia. This may explain the patient's right-sided chest pain and dyspnea. 2. No acute pulmonary embolus. Electronically Signed   By: Tollie Eth M.D.   On: 11/17/2016 21:42    Cardiac Studies   Echo pending  Patient Profile     31 y.o. male w/ history of ETOH, drug and tobacco use who presents with R sided chest pain and troponinemia.   Assessment & Plan    Troponin elevation - possible cocaine vasospasm vs demand ischemia in setting of heavy drug use.   -troponin 1.28-1.0 -CTA with PNA at site of chest pain will begin ABX  -echo pending -colchicine was started yesterday for pleuritic component ?   PNA- started rocephin and oral Z pak Dr. Delton See to see  Drug and ETOH use -monitor -IVF -ativan withdrawal orders and thiamine added  -pt states " I am stopping cold [place]"  He was not intrested in rehab.  For questions or updates, please contact [facility] Please consult [email or web address] for contact info under Cardiology/STEMI.     Signed, Nada Boozer, NP  11/19/2016, 8:25 AM    The patient was seen, examined and discussed with Nada Boozer, NP and I agree with the above.   Troponin is now downtrending, the patient has residual muscular pain - sec to fall (CK 13.000) and pleuritic type of pain - with evidence of pneumonia on chest CT, he was started on iv antibiotics, we will switch to PO -Augmentin BID x 1 week. If this is an aspiration  pneumonia - Augmentin would cover anaerobes as well. Also I will add losartan 25 mg po daily as he is hypertensive and has significant LVH on ECG. He has a diastolic murmur. Echo is pending. We will discharge today and arrange for a follow up.  Tobias Alexander, MD 11/19/2016  Addendum:  Echocardiogram showed LVEF 45-50% with mild diffuse hypokinesis and moderate to severe aortic regurgitation, aortic valve appears bicuspid. The patient was placed on losartan already. We'll plan on discharge and following in our clinic.  Tobias Alexander, MD 11/19/2016

## 2016-11-22 LAB — CULTURE, BLOOD (ROUTINE X 2)
Culture: NO GROWTH
Culture: NO GROWTH
Special Requests: ADEQUATE
Special Requests: ADEQUATE

## 2016-12-07 ENCOUNTER — Encounter: Payer: Self-pay | Admitting: Cardiology

## 2016-12-07 ENCOUNTER — Encounter: Payer: Self-pay | Admitting: *Deleted

## 2016-12-07 ENCOUNTER — Ambulatory Visit (INDEPENDENT_AMBULATORY_CARE_PROVIDER_SITE_OTHER): Payer: Self-pay | Admitting: Cardiology

## 2016-12-07 VITALS — BP 96/58 | HR 74 | Ht 72.0 in | Wt 138.0 lb

## 2016-12-07 DIAGNOSIS — I351 Nonrheumatic aortic (valve) insufficiency: Secondary | ICD-10-CM

## 2016-12-07 DIAGNOSIS — Z01812 Encounter for preprocedural laboratory examination: Secondary | ICD-10-CM

## 2016-12-07 MED ORDER — LOSARTAN POTASSIUM 25 MG PO TABS: 25.0000 mg | ORAL_TABLET | Freq: Every day | ORAL | 6 refills | Status: DC

## 2016-12-07 MED ORDER — FOLIC ACID 1 MG PO TABS: 1.0000 mg | ORAL_TABLET | Freq: Every day | ORAL | 6 refills | Status: DC

## 2016-12-07 NOTE — Patient Instructions (Addendum)
Your physician recommends that you continue on your current medications as directed. Please refer to the Current Medication list given to you today.  Your physician recommends that you return for lab work in:  Today bmet   Your physician has requested that you have a TEE. During a TEE, sound waves are used to create images of your heart. It provides your doctor with information about the size and shape of your heart and how well your heart's chambers and valves are working. In this test, a transducer is attached to the end of a flexible tube that's guided down your throat and into your esophagus (the tube leading from you mouth to your stomach) to get a more detailed image of your heart. You are not awake for the procedure. Please see the instruction sheet given to you today. For further information please visit [email or web address]    Your physician recommends that you schedule a follow-up appointment in:  2 WEEKS WITH BRITTIANY SIMMONS  Transesophageal Echocardiogram Transesophageal echocardiography (TEE) is a picture test of your heart using sound waves. The pictures taken can give very detailed pictures of your heart. This can help your doctor see if there are problems with your heart. TEE can check:  If your heart has blood clots in it.  How well your heart valves are working.  If you have an infection on the inside of your heart.  Some of the major arteries of your heart.  If your heart valve is working after a Psychologist, forensic.  Your heart before a procedure that uses a shock to your heart to get the rhythm back to normal.  What happens before the procedure?  Do not eat or drink for 6 hours before the procedure or as told by your doctor.  Make plans to have someone drive you home after the procedure. Do not drive yourself home.  An IV tube will be put in your arm. What happens during the procedure?  You will be given a medicine to help you relax (sedative). It will be given through  the IV tube.  A numbing medicine will be sprayed or gargled in the back of your throat to help numb it.  The tip of the probe is placed into the back of your mouth. You will be asked to swallow. This helps to pass the probe into your esophagus.  Once the tip of the probe is in the right place, your doctor can take pictures of your heart.  You may feel pressure at the back of your throat. What happens after the procedure?  You will be taken to a recovery area so the sedative can wear off.  Your throat may be sore and scratchy. This will go away slowly over time.  You will go home when you are fully awake and able to swallow liquids.  You should have someone stay with you for the next 24 hours.  Do not drive or operate machinery for the next 24 hours. This information is not intended to replace advice given to you by your health care provider. Make sure you discuss any questions you have with your health care provider. Document Released: 12/06/2008 Document Revised: 07/17/2015 Document Reviewed: 08/10/2012 Elsevier Interactive Patient Education  [employer].

## 2016-12-07 NOTE — Progress Notes (Signed)
12/07/2016 Paul Mcdowell.   [birth year]  [id number]  Primary Physician Patient, No Pcp Per Primary Cardiologist: Dr. Delton See    Reason for Visit/CC: [facility] F/u, LV Dysfunction, Bicuspid AV + AI.   HPI:  Paul Mcdowell. is a 31 y.o. male who is being seen today for post hospital f/u.   He is a 31 yo male w/ history of ETOH, drug and tobacco use who recently presented to ED with R sided chest pain, worse w/ deep inspiration and movement of right arm. Pain also worse with palpation of chest wall. He has had years of crack cocaine use.  Also with ETOH and recently tried IV heroin. He woke 11/17/16 with Rt sides chest pain.  No early FH of CAD. He did fall the day prior with the drug use.  He was also "out" due to the drugs.    In the ER his troponin was positive with peak at 1.01.  He was admitted for further w/u. Troponin were cycled and continued to trend downward. He was seen by Dr. Delton See. CK was elevated at 13.4. 2D echo showed mildly reduced LVEF at 45%-50% with mild diffuse hypokinesis. Aortic valve felt to be bicuspid with moderate to severe AI. Pt also had cardiac CT neg for dissection and no PE. He was noted to have Rt LL infiltrate consistent with pneumonia.  He was started on antibiotics. Augmentin BID x 1 week. If this is an aspiration pneumonia - Augmentin would cover anaerobes as well. For his substance abuse he was placed on ativan for withdrawal but only took one dose.  Colchicine was added for pain but with dx of PNA this was stopped.  He was placed on Losartan for LV dysfunction and HTN.   Dr. Delton See felt that his pain was musculoskeletal 2/2 fall w/ CK level of 13. It was felt that his troponin elevation was 2/2 drug use, ? Vasospasm from cocaine/ demand ischemia.The plan is for pt to follow up after ABX and then plan TEE to better assess his aortic valve. The patient was discharged home on 11/19/16.   He presents back for f/u. He has done fairly well. He  denies any recurrent cocaine or heroin use. Still with musculoskeletal pain but improved some. He has shortness of breath, which he contributes to smoking. Has been a smoker since age 58. He denies cough, fever and chills. No LEE.   Current Meds  Medication Sig  . CRANBERRY PO Take 1 tablet by mouth daily.  . folic acid (FOLVITE) 1 MG tablet Take 1 tablet (1 mg total) by mouth daily.  Marland Kitchen losartan (COZAAR) 25 MG tablet Take 1 tablet (25 mg total) by mouth daily.  . multivitamin (ONE-A-DAY MEN'S) TABS tablet Take 1 tablet by mouth daily.  . Omega-3 Fatty Acids (FISH OIL PO) Take 1 capsule by mouth daily.   Allergies  Allergen Reactions  . Molds & Smuts Shortness Of Breath and Other (See Comments)    (Mold and mildew) both FLARE THE PATIENT'S ASTHMA   Past Medical History:  Diagnosis Date  . 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 Asthma   . Bicuspid aortic valve 11/19/2016  . Chest pain 11/19/2016  . LV dysfunction 11/19/2016  . Pneumonia 11/19/2016  . Substance abuse (HCC) 11/19/2016   Family History  Problem Relation Age of Onset  . Cancer Mother    History reviewed. No pertinent surgical history. Social History   Social History  .  Marital status: Single    Spouse name: N/A  . Number of children: N/A  . Years of education: N/A   Occupational History  . Not on file.   Social History Main Topics  . Smoking status: Current Every Day Smoker    Packs/day: 1.00    Types: Cigarettes  . Smokeless tobacco: Never Used  . Alcohol use Yes  . Drug use: Yes    Frequency: 5.0 times per week    Types: Marijuana, IV, Cocaine     Comment: last used herioin and xanax on 11/14/16, states last used crack last week  . Sexual activity: Not on file   Other Topics Concern  . Not on file   Social History Narrative  . No narrative on file     Review of Systems: General: negative for chills, fever, night sweats or weight changes.  Cardiovascular: negative for chest  pain, dyspnea on exertion, edema, orthopnea, palpitations, paroxysmal nocturnal dyspnea or shortness of breath Dermatological: negative for rash Respiratory: negative for cough or wheezing Urologic: negative for hematuria Abdominal: negative for nausea, vomiting, diarrhea, bright red blood per rectum, melena, or hematemesis Neurologic: negative for visual changes, syncope, or dizziness All other systems reviewed and are otherwise negative except as noted above.   Physical Exam:  Blood pressure (!) 96/58, pulse 74, height 6' (1.829 m), weight 138 lb (62.6 kg), SpO2 97 %.  General appearance: alert, cooperative and no distress Neck: no carotid bruit and no JVD Lungs: clear to auscultation bilaterally Heart: regular rate and rhythm and hyperdynamic 1/6 murmur noted Extremities: extremities normal, atraumatic, no cyanosis or edema Pulses: 2+ and symmetric Skin: Skin color, texture, turgor normal. No rashes or lesions Neurologic: Grossly normal  EKG not performed -- personally reviewed   ASSESSMENT AND PLAN:   1. Elevated Troponin: felt secondary to demand ischemia/ ? Vasospasm from cocaine use.   2. LV Systolic Dysfunction: recent echo showed EF of 45-50% with diffuse hypokinesis. Losartan added. No BB given cocaine use. He is euvolemic on exam. Tolerating ARB ok. Will check BMP to monitor renal function and K.   3. Musculoskeletal Pain: 2/2 to fall w/ drug use. CK was elevated at 13. Still with reproducible pain with palpation.   4. Bicuspid Aortic Valve w/ Moderate to Severe AI: needs TEE to better assess.   5. PNA: noted on recent CXR. Possible aspiration from drug use/ ETOH intoxication. He was placed on antibiotics, Augmentin BID x 1 week. He denies cough, fever and chills.   6. Polysubstance Abuse: he admits that he still smokes and drinks ETOH, but denies any recurrent cocaine of heroin use.   [facility] has been requested to perform a transesophageal echocardiogram on  Paul Mcdowell. for aortic insuffiency.  After careful review of history and examination, the risks and benefits of transesophageal echocardiogram have been explained including risks of esophageal damage, perforation (1:10,000 risk), bleeding, pharyngeal hematoma as well as other potential complications associated with conscious sedation including aspiration, arrhythmia, respiratory failure and death. Alternatives to treatment were discussed, questions were answered. Patient is willing to proceed.    Follow-Up w/ Dr. Delton See post TEE.   Brittainy Delmer Islam, MHS [facility] 12/07/2016 2:16 PM

## 2016-12-08 LAB — BASIC METABOLIC PANEL
BUN / CREAT RATIO: 9 (ref 9–20)
BUN: 8 mg/dL (ref 6–20)
CO2: 25 mmol/L (ref 20–29)
Calcium: 9.9 mg/dL (ref 8.7–10.2)
Chloride: 101 mmol/L (ref 96–106)
Creatinine, Ser: 0.85 mg/dL (ref 0.76–1.27)
GFR, EST AFRICAN AMERICAN: 136 mL/min/{1.73_m2} (ref >59)
GFR, EST NON AFRICAN AMERICAN: 118 mL/min/{1.73_m2} (ref >59)
Glucose: 52 mg/dL — ABNORMAL LOW (ref 65–99)
POTASSIUM: 4.2 mmol/L (ref 3.5–5.2)
Sodium: 144 mmol/L (ref 134–144)

## 2016-12-13 ENCOUNTER — Ambulatory Visit (HOSPITAL_COMMUNITY): Admission: RE | Admit: 2016-12-13 | Payer: Self-pay | Source: Ambulatory Visit | Admitting: Cardiology

## 2016-12-13 ENCOUNTER — Telehealth: Payer: Self-pay

## 2016-12-13 ENCOUNTER — Encounter (HOSPITAL_COMMUNITY): Admission: RE | Payer: Self-pay | Source: Ambulatory Visit

## 2016-12-13 SURGERY — ECHOCARDIOGRAM, TRANSESOPHAGEAL
Anesthesia: Moderate Sedation

## 2016-12-13 NOTE — Telephone Encounter (Signed)
Informed Paul Mcdowell of results and verbal understanding expressed.  Paul Mcdowell states he didn't get up in time for his TEE this AM.  Rescheduled Paul Mcdowell for 10/24 at 1400 with Dr. Elease HashimotoNahser. Reviewed instructions and directions to Admitting (Main Entrance A). He understands to arrive at 1230 and to fast after midnight.  Paul Mcdowell agrees with treatment plan.

## 2016-12-13 NOTE — Progress Notes (Signed)
Patient was no show to TEE scheduled on 10.22.18 @ 1000. Called and left VM to call back for rescheduling info.

## 2016-12-13 NOTE — Telephone Encounter (Signed)
Called and confirmed with Central Scheduling Chip Boer(Vicki) that anesthesia is available and scheduled for case.  Confirmation # [id number]

## 2016-12-13 NOTE — Telephone Encounter (Signed)
-----   Message from Allayne ButcherBrittainy M Simmons, [place]PA-C sent at 12/08/2016  5:23 PM EDT ----- Labs look ok. Renal function and K stable. Continue losartan. No changes.

## 2016-12-14 ENCOUNTER — Encounter: Payer: Self-pay | Admitting: Cardiology

## 2016-12-15 ENCOUNTER — Ambulatory Visit: Payer: Self-pay | Admitting: Family Medicine

## 2016-12-15 ENCOUNTER — Ambulatory Visit (HOSPITAL_COMMUNITY)
Admission: RE | Admit: 2016-12-15 | Discharge: 2016-12-15 | Disposition: A | Discharge status: Home / Self Care | Payer: Self-pay | Source: Ambulatory Visit | Attending: Cardiovascular Disease | Admitting: Cardiovascular Disease

## 2016-12-15 ENCOUNTER — Ambulatory Visit (HOSPITAL_COMMUNITY): Payer: Self-pay | Admitting: Certified Registered"

## 2016-12-15 ENCOUNTER — Ambulatory Visit (HOSPITAL_BASED_OUTPATIENT_CLINIC_OR_DEPARTMENT_OTHER): Payer: Self-pay

## 2016-12-15 ENCOUNTER — Encounter (HOSPITAL_COMMUNITY): Payer: Self-pay | Admitting: *Deleted

## 2016-12-15 ENCOUNTER — Encounter (HOSPITAL_COMMUNITY)
Admission: RE | Disposition: A | Discharge status: Home / Self Care | Payer: Self-pay | Source: Ambulatory Visit | Attending: Cardiovascular Disease

## 2016-12-15 DIAGNOSIS — I351 Nonrheumatic aortic (valve) insufficiency: Secondary | ICD-10-CM

## 2016-12-15 DIAGNOSIS — J45909 Unspecified asthma, uncomplicated: Secondary | ICD-10-CM | POA: Insufficient documentation

## 2016-12-15 DIAGNOSIS — I509 Heart failure, unspecified: Secondary | ICD-10-CM | POA: Insufficient documentation

## 2016-12-15 DIAGNOSIS — I110 Hypertensive heart disease with heart failure: Secondary | ICD-10-CM | POA: Insufficient documentation

## 2016-12-15 DIAGNOSIS — Q231 Congenital insufficiency of aortic valve: Secondary | ICD-10-CM | POA: Insufficient documentation

## 2016-12-15 HISTORY — PX: TEE WITHOUT CARDIOVERSION: SHX5443

## 2016-12-15 SURGERY — ECHOCARDIOGRAM, TRANSESOPHAGEAL
Anesthesia: Monitor Anesthesia Care

## 2016-12-15 MED ORDER — BUTAMBEN-TETRACAINE-BENZOCAINE 2-2-14 % EX AERO
INHALATION_SPRAY | CUTANEOUS | Status: DC | PRN
  Administered 2016-12-15: 2 via TOPICAL

## 2016-12-15 MED ORDER — ALBUTEROL SULFATE HFA 108 (90 BASE) MCG/ACT IN AERS
INHALATION_SPRAY | RESPIRATORY_TRACT | Status: DC | PRN
  Administered 2016-12-15: 2 via RESPIRATORY_TRACT

## 2016-12-15 MED ORDER — LIDOCAINE 2% (20 MG/ML) 5 ML SYRINGE
INTRAMUSCULAR | Status: DC | PRN
  Administered 2016-12-15: 30 mg via INTRAVENOUS

## 2016-12-15 MED ORDER — PROPOFOL 500 MG/50ML IV EMUL
INTRAVENOUS | Status: DC | PRN
  Administered 2016-12-15: 100 ug/kg/min via INTRAVENOUS

## 2016-12-15 MED ORDER — SODIUM CHLORIDE 0.9 % IV SOLN
INTRAVENOUS | Status: DC
  Administered 2016-12-15: 14:00:00 via INTRAVENOUS

## 2016-12-15 MED ORDER — PROPOFOL 10 MG/ML IV BOLUS
INTRAVENOUS | Status: DC | PRN
  Administered 2016-12-15: 20 mg via INTRAVENOUS
  Administered 2016-12-15: 30 mg via INTRAVENOUS
  Administered 2016-12-15: 40 mg via INTRAVENOUS
  Administered 2016-12-15: 30 mg via INTRAVENOUS

## 2016-12-15 NOTE — CV Procedure (Signed)
    Transesophageal Echocardiogram Note  Morrell RiddleDavid M Pinkstaff Jr. [id number] 1985/06/06  Procedure: Transesophageal Echocardiogram Indications: bicuspid AV,  Evaluate AI   Procedure Details Consent: Obtained Time Out: Verified patient identification, verified procedure, site/side was marked, verified correct patient position, special equipment/implants available, Radiology Safety Procedures followed,  medications/allergies/relevent history reviewed, required imaging and test results available.  Performed  Medications:  During this procedure the patient is administered a propofol drip - total 277 mg by Raynelle FanningJulie, CRNA.     The patient's heart rate, blood pressure, and oxygen saturation are monitored continuously during the procedure. The period of conscious sedation is 30  minutes, of which I was present face-to-face 100% of this time.  Left Ventrical:  Normal   Mitral Valve: normal   Aortic Valve: bicupsid AV.   Mild - moderate AI   Tricuspid Valve: normal   Pulmonic Valve: poorly vis   Left Atrium/ Left atrial appendage: no thrombi   Atrial septum: grossly intact   Aorta:  Normal    Complications: No apparent complications Patient did tolerate procedure well.   Vesta MixerPhilip J. Emara Lichter, Montez HagemanJr., MD, [facility]ACC 12/15/2016, 1:58 PM

## 2016-12-15 NOTE — Interval H&P Note (Signed)
History and Physical Interval Note:  12/15/2016 3:27 PM  Paul Riddleavid M Shutt Jr.  has presented today for surgery, with the diagnosis of BICUSPID AORTIC VALVE  The various methods of treatment have been discussed with the patient and family. After consideration of risks, benefits and other options for treatment, the patient has consented to  Procedure(s): TRANSESOPHAGEAL ECHOCARDIOGRAM (TEE) WITH ANESTHESIA (N/A) as a surgical intervention .  The patient's history has been reviewed, patient examined, no change in status, stable for surgery.  I have reviewed the patient's chart and labs.  Questions were answered to the patient's satisfaction.     Kristeen MissPhilip Nahser

## 2016-12-15 NOTE — Anesthesia Preprocedure Evaluation (Signed)
Anesthesia Evaluation  Patient identified by MRN, date of birth, ID band Patient awake    Reviewed: Allergy & Precautions, NPO status , Patient's Chart, lab work & pertinent test results  History of Anesthesia Complications Negative for: history of anesthetic complications  Airway Mallampati: II  TM Distance: >3 FB Neck ROM: Full    Dental  (+) Teeth Intact   Pulmonary asthma , Current Smoker,    breath sounds clear to auscultation       Cardiovascular +CHF  + Valvular Problems/Murmurs AI  Rhythm:Regular     Neuro/Psych negative neurological ROS  negative psych ROS   GI/Hepatic negative GI ROS, (+)     substance abuse  alcohol use, cocaine use and IV drug use,   Endo/Other  negative endocrine ROS  Renal/GU negative Renal ROS     Musculoskeletal   Abdominal   Peds  Hematology negative hematology ROS (+)   Anesthesia Other Findings Bicuspid AV with AI, Ef 45%  Reproductive/Obstetrics                             Anesthesia Physical Anesthesia Plan  ASA: II  Anesthesia Plan: MAC   Post-op Pain Management:    Induction: Intravenous  PONV Risk Score and Plan:   Airway Management Planned: Nasal Cannula  Additional Equipment: None  Intra-op Plan:   Post-operative Plan:   Informed Consent: I have reviewed the patients History and Physical, chart, labs and discussed the procedure including the risks, benefits and alternatives for the proposed anesthesia with the patient or authorized representative who has indicated his/her understanding and acceptance.   Dental advisory given  Plan Discussed with: Surgeon and CRNA  Anesthesia Plan Comments:         Anesthesia Quick Evaluation

## 2016-12-15 NOTE — Anesthesia Postprocedure Evaluation (Signed)
Anesthesia Post Note  Patient: [name].  Procedure(s) Performed: TRANSESOPHAGEAL ECHOCARDIOGRAM (TEE) WITH ANESTHESIA (N/A )     Patient location during evaluation: Endoscopy Anesthesia Type: MAC Level of consciousness: awake and alert Pain management: pain level controlled Vital Signs Assessment: post-procedure vital signs reviewed and stable Respiratory status: spontaneous breathing, nonlabored ventilation, respiratory function stable and patient connected to nasal cannula oxygen Cardiovascular status: stable and blood pressure returned to baseline Postop Assessment: no apparent nausea or vomiting Anesthetic complications: no    Last Vitals:  Vitals:   12/15/16 1410 12/15/16 1421  BP: 129/81 (!) 180/74  Pulse:  (!) 51  Resp:  14  Temp:    SpO2:  100%    Last Pain:  Vitals:   12/15/16 1405  TempSrc: Oral                 Natashia Roseman

## 2016-12-15 NOTE — H&P (View-Only) (Signed)
Progress Note  Patient Name: Paul AndaDavid Michael Hoskin Jr. Date of Encounter: 11/19/2016  Primary Cardiologist: New  Subjective   Still with chest pain, Lt upper chest no nausea, increases with deep breath  Inpatient Medications    Scheduled Meds: . colchicine  0.6 mg Oral BID  . enoxaparin (LOVENOX) injection  40 mg Subcutaneous Q24H  . folic acid  1 mg Oral Daily  . LORazepam  0-4 mg Oral Q6H   Followed by  . [START ON 11/20/2016] LORazepam  0-4 mg Oral Q12H  . multivitamin with minerals  1 tablet Oral Daily  . thiamine  100 mg Oral Daily   Or  . thiamine  100 mg Intravenous Daily   Continuous Infusions: . sodium chloride 125 mL/hr at 11/18/16 2212   PRN Meds: acetaminophen, albuterol, LORazepam **OR** LORazepam, zolpidem   Vital Signs    Vitals:   11/18/16 1700 11/18/16 2105 11/19/16 0000 11/19/16 0632  BP:  (!) 149/82 130/78 (!) 153/98  Pulse:  61 84 62  Resp: 20 20    Temp: 97.8 F (36.6 C) 99.5 F (37.5 C)  98.5 F (36.9 C)  TempSrc: Oral Oral  Oral  SpO2: 98% 100%  99%  Weight:    145 lb 1.6 oz (65.8 kg)  Height:        Intake/Output Summary (Last 24 hours) at 11/19/16 0825 Last data filed at 11/19/16 [id number]  Gross per 24 hour  Intake             2820 ml  Output             3900 ml  Net            -1080 ml   Filed Weights   11/17/16 1659 11/18/16 0035 11/19/16 [id number]  Weight: 155 lb (70.3 kg) 141 lb (64 kg) 145 lb 1.6 oz (65.8 kg)    Telemetry    SR - Personally Reviewed  ECG    No new - Personally Reviewed  Physical Exam   GEN: No acute distress.   Neck: No JVD Cardiac: RRR, 3/6 diastolic murmur, rubs, or gallops. + extreme tenderness to Lt upper chest to palpation  Respiratory: Clear to auscultation bilaterally. GI: Soft, nontender, non-distended  MS: No edema; No deformity. Neuro:  Nonfocal  Psych: Normal affect   Labs    Chemistry Recent Labs Lab 11/17/16 1704  NA 136  K 3.9  CL 99*  CO2 28  GLUCOSE 93  BUN 11  CREATININE  1.21  CALCIUM 9.6  GFRNONAA >60  GFRAA >60  ANIONGAP 9     Hematology Recent Labs Lab 11/17/16 1704 11/18/16 0605 11/19/16 0421  WBC 11.4* 10.1 9.1  RBC 5.05 4.44 4.33  HGB 16.1 13.8 13.4  HCT 46.1 40.2 39.4  MCV 91.3 90.5 91.0  MCH 31.9 31.1 30.9  MCHC 34.9 34.3 34.0  RDW 12.2 12.3 12.0  PLT 229 208 205    Cardiac Enzymes Recent Labs Lab 11/18/16 0239 11/18/16 0605 11/18/16 1513  TROPONINI 1.28* 1.01* 0.42*    Recent Labs Lab 11/17/16 1725 11/17/16 2029  TROPIPOC 0.94* 1.14*     BNPNo results for input(s): BNP, PROBNP in the last 168 hours.   DDimer No results for input(s): DDIMER in the last 168 hours.   Radiology    Dg Chest 2 View  Result Date: 11/17/2016 CLINICAL DATA:  Chest pain after fall EXAM: CHEST  2 VIEW COMPARISON:  None. FINDINGS: The heart size and mediastinal contours are  within normal limits. Both lungs are clear. The visualized skeletal structures are unremarkable. IMPRESSION: No active cardiopulmonary disease. Electronically Signed   By: Kim  Fujinaga M.D.   On: 11/17/2016 17:44   Ct Angio Chest Pe W And/or Wo Contrast  Result Date: 11/17/2016 CLINICAL DATA:  Right-sided chest pain and dyspnea x4 days. EXAM: CT ANGIOGRAPHY CHEST WITH CONTRAST TECHNIQUE: Multidetector CT imaging of the chest was performed using the standard protocol during bolus administration of intravenous contrast. Multiplanar CT image reconstructions and MIPs were obtained to evaluate the vascular anatomy. CONTRAST:  70 cc Isovue 370 IV COMPARISON:  CXR from the same day FINDINGS: Cardiovascular: The study is of quality for the evaluation of pulmonary embolism. There are no filling defects in the central, lobar, segmental or subsegmental pulmonary artery branches to suggest acute pulmonary embolism. Great vessels are normal in course and caliber. Normal heart size. No significant pericardial fluid/thickening. Mediastinum/Nodes: No discrete thyroid nodules. Unremarkable  esophagus. No pathologically enlarged axillary, mediastinal or hilar lymph nodes. Lungs/Pleura: No pneumothorax. No pleural effusion. Faint intraparenchymal pulmonary opacities in the superior segment of right lower lobe may represent a small focus of pneumonia, series 9, image 60. Upper abdomen: Unremarkable. Musculoskeletal:  No aggressive appearing focal osseous lesions. Review of the MIP images confirms the above findings. IMPRESSION: 1. Small focus of pulmonary consolidation in the superior segment of the right lower lobe consistent pneumonia. This may explain the patient's right-sided chest pain and dyspnea. 2. No acute pulmonary embolus. Electronically Signed   By: Jaimeson  Kwon M.D.   On: 11/17/2016 21:42    Cardiac Studies   Echo pending  Patient Profile     31 y.o. male w/ history of ETOH, drug and tobacco use who presents with R sided chest pain and troponinemia.   Assessment & Plan    Troponin elevation - possible cocaine vasospasm vs demand ischemia in setting of heavy drug use.   -troponin 1.28-1.0 -CTA with PNA at site of chest pain will begin ABX  -echo pending -colchicine was started yesterday for pleuritic component ?   PNA- started rocephin and oral Z pak Dr. Boston Cookson to see  Drug and ETOH use -monitor -IVF -ativan withdrawal orders and thiamine added  -pt states " I am stopping cold turkey"  He was not intrested in rehab.  For questions or updates, please contact [facility] Please consult [email or web address] for contact info under Cardiology/STEMI.     Signed, Laura Ingold, NP  11/19/2016, 8:25 AM    The patient was seen, examined and discussed with Laura Ingold, NP and I agree with the above.   Troponin is now downtrending, the patient has residual muscular pain - sec to fall (CK 13.000) and pleuritic type of pain - with evidence of pneumonia on chest CT, he was started on iv antibiotics, we will switch to PO -Augmentin BID x 1 week. If this is an aspiration  pneumonia - Augmentin would cover anaerobes as well. Also I will add losartan 25 mg po daily as he is hypertensive and has significant LVH on ECG. He has a diastolic murmur. Echo is pending. We will discharge today and arrange for a follow up.  Abiageal Blowe, MD 11/19/2016  Addendum:  Echocardiogram showed LVEF 45-50% with mild diffuse hypokinesis and moderate to severe aortic regurgitation, aortic valve appears bicuspid. The patient was placed on losartan already. We'll plan on discharge and following in our clinic.  Ivadell Gaul, MD 11/19/2016    

## 2016-12-15 NOTE — Transfer of Care (Signed)
Immediate Anesthesia Transfer of Care Note  Patient: Paul Mcdowell.  Procedure(s) Performed: TRANSESOPHAGEAL ECHOCARDIOGRAM (TEE) WITH ANESTHESIA (N/A )  Patient Location: Endoscopy Unit  Anesthesia Type:MAC  Level of Consciousness: awake, alert  and oriented  Airway & Oxygen Therapy: Patient Spontanous Breathing  Post-op Assessment: Report given to RN  Post vital signs: Reviewed and stable  Last Vitals:  Vitals:   12/15/16 1254 12/15/16 1405  BP: (!) 154/69   Pulse:  76  Resp: 16 13  Temp: 36.4 C 36.5 C  SpO2: 100% 100%    Last Pain:  Vitals:   12/15/16 1405  TempSrc: Oral         Complications: No apparent anesthesia complications

## 2016-12-15 NOTE — Discharge Instructions (Signed)
Transesophageal Echocardiogram  Transesophageal echocardiography (TEE) is a special type of test that produces images of the heart by using sound waves (echocardiogram). This type of echocardiography can obtain better images of the heart than standard echocardiography. TEE is done by passing a flexible tube down the esophagus. The heart is located in front of the esophagus. Because the heart and esophagus are close to one another, your health care provider can take very clear, detailed pictures of the heart via ultrasound waves.  TEE may be done:  · If your health care provider needs more information based on standard echocardiography findings.  · If you had a stroke. This might have happened because a clot formed in your heart. TEE can visualize different areas of the heart and check for clots.  · To check valve anatomy and function.  · To check for infection on the inside of your heart (endocarditis).  · To evaluate the dividing wall (septum) of the heart and presence of a hole that did not close after birth (patent foramen ovale or atrial septal defect).  · To help diagnose a tear in the wall of the aorta (aortic dissection).  · During cardiac valve surgery. This allows the surgeon to assess the valve repair before closing the chest.  · During a variety of other cardiac procedures to guide positioning of catheters.  · Sometimes before a cardioversion, which is a shock to convert heart rhythm back to normal.    Tell a health care provider about:  · Any allergies you have.  · All medicines you are taking, including vitamins, herbs, eye drops, creams, and over-the-counter medicines.  · Any problems you or family members have had with anesthetic medicines.  · Any blood disorders you have.  · Any surgeries you have had.  · Any medical conditions you have.  · Swallowing difficulties.  · An esophageal obstruction.  What are the risks?  Generally, TEE is a safe procedure. However, as with any procedure, complications can  occur. Possible complications include an esophageal tear (rupture).  What happens before the procedure?  · Do not eat or drink for 6 hours before the procedure or as directed by your health care provider.  · Arrange for someone to drive you home after the procedure. Do not drive yourself home. During the procedure, you will be given medicines that can continue to make you feel drowsy and can impair your reflexes.  · An IV access tube will be started in the arm.  What happens during the procedure?  · A medicine to help you relax (sedative) will be given through the IV access tube.  · A medicine may be sprayed or gargled to numb the back of the throat.  · Your blood pressure, heart rate, and breathing (vital signs) will be monitored during the procedure.  · The TEE probe is a long, flexible tube. The tip of the probe is placed into the back of the mouth, and you will be asked to swallow. This helps to pass the tip of the probe into the esophagus. Once the tip of the probe is in the correct area, your health care provider can take pictures of the heart.  · TEE is usually not a painful procedure. You may feel the probe press against the back of the throat. The probe does not enter the trachea and does not affect your breathing.  What happens after the procedure?  · You will be in bed, resting, until you have fully returned to   consciousness.  · When you first awaken, your throat may feel slightly sore and will probably still feel numb. This will improve slowly over time.  · You will not be allowed to eat or drink until it is clear that the numbness has improved.  · Once you have been able to drink, urinate, and sit on the edge of the bed without feeling sick to your stomach (nausea) or dizzy, you may be cleared to go home.  · You should have a friend or family member with you for the next 24 hours after your procedure.  This information is not intended to replace advice given to you by your health care provider. Make  sure you discuss any questions you have with your health care provider.  Document Released: 05/01/2002 Document Revised: 07/17/2015 Document Reviewed: 08/10/2012  Elsevier Interactive Patient Education © 2018 [employer]

## 2016-12-15 NOTE — Progress Notes (Signed)
  Echocardiogram Echocardiogram Transesophageal has been performed.  Celene SkeenVijay  Adelene Polivka 12/15/2016, 2:33 PM

## 2016-12-18 ENCOUNTER — Encounter (HOSPITAL_COMMUNITY): Payer: Self-pay | Admitting: Cardiovascular Disease

## 2016-12-30 ENCOUNTER — Ambulatory Visit: Payer: Self-pay | Admitting: Cardiology

## 2017-01-19 ENCOUNTER — Other Ambulatory Visit: Payer: Self-pay | Admitting: Cardiology

## 2017-01-21 ENCOUNTER — Ambulatory Visit (INDEPENDENT_AMBULATORY_CARE_PROVIDER_SITE_OTHER): Payer: Self-pay | Admitting: Cardiology

## 2017-01-21 ENCOUNTER — Encounter: Payer: Self-pay | Admitting: Cardiology

## 2017-01-21 VITALS — BP 122/64 | HR 68 | Resp 16 | Ht 71.0 in | Wt 143.8 lb

## 2017-01-21 DIAGNOSIS — Q231 Congenital insufficiency of aortic valve: Secondary | ICD-10-CM

## 2017-01-21 MED ORDER — FOLIC ACID 1 MG PO TABS: 1.0000 mg | ORAL_TABLET | Freq: Every day | ORAL | 3 refills | Status: DC

## 2017-01-21 MED ORDER — LOSARTAN POTASSIUM 25 MG PO TABS: 25.0000 mg | ORAL_TABLET | Freq: Every day | ORAL | 3 refills | Status: DC

## 2017-01-21 MED ORDER — ONE-A-DAY MENS PO TABS: 1.0000 | ORAL_TABLET | Freq: Every day | ORAL | 3 refills | Status: AC

## 2017-01-21 NOTE — Progress Notes (Signed)
01/21/2017 Paul Riddleavid M Cuppett Jr.   April 14, 1985  [id number]  Primary Physician Patient, No Pcp Per Primary Cardiologist: Dr. Delton SeeNelson   Reason for Visit/CC: F/u for LV Dysfunction, bicuspid aortic valve and aortic insuffiency  HPI:  Paul Mcdowell is a 31 yo male w/ history of ETOH, drug and tobacco use who recently presented to ED in September with R sided chest pain, worse w/ deep inspiration and movement of right arm. Pain also worse with palpation of chest wall.He has had years of crack cocaine use. Also with ETOH and recently tried IV heroin. He woke 11/17/16 with Rt sides chest pain. No early FH of CAD. He did fall the day prior with the drug use. He was also "out" due to the drugs.   In the ER his troponin was positive with peak at 1.01. He was admitted for further w/u. Troponin were cycled and continued to trend downward. He was seen by Dr. Delton SeeNelson. CK was elevated at 13.4. 2D echo showed mildly reduced LVEF at 45%-50% with mild diffuse hypokinesis. Aortic valve felt to be bicuspid with moderate to severe AI. Pt also had cardiac CT neg for dissection and no PE. He was noted to have Rt LL infiltrate consistent with pneumonia. He was started on antibiotics. Augmentin BID x 1 week. If this is an aspiration pneumonia - Augmentin would cover anaerobes as well.For his substance abuse he was placed on ativan for withdrawal but only took one dose. Colchicine was added for pain but with dx of PNA this was stopped. He was placed on Losartan for LV dysfunction and HTN.   Dr. Delton SeeNelson felt that his pain was musculoskeletal 2/2 fall w/ CK level of 13. It was felt that his troponin elevation was 2/2 drug use, ? Vasospasm from cocaine/ demand ischemia.The plan is for pt to follow up after ABX and then plan TEE to better assess his aortic valve. The patient was discharged home on 11/19/16.   I saw him for post hospital f/u on 12/07/16 and was doing better. He was set up for TEE on 12/15/16 w/ Dr. Elease HashimotoNahser. This showed  "normal" systolic function. No EF was noted on report. Aortic valve noted to be bicuspid but AI was noted to be less severe compared to previous TTE report, now considered mild to moderate.   Today in clinic he is asymptomatic but notes that he often has palpitations. He drinks green tea often and was also using [employer] but has cut back. He admits that he continues to use cocaine, but not as frequently as before. Prior to his hospitalization, he was using every day. Now just a few times a months. Still smokes and drinks alcohol.   TEE 12/15/16 Study Conclusions  - Left ventricle: The cavity size was normal. Wall thickness was   normal. Systolic function was normal. - Aortic valve: Bicuspid. There was mild to moderate regurgitation. - Mitral valve: No evidence of vegetation. - Left atrium: No evidence of thrombus in the atrial cavity or   appendage. - Tricuspid valve: No evidence of vegetation.   No outpatient medications have been marked as taking for the 01/21/17 encounter (Office Visit) with Allayne ButcherSimmons, Eulonda Andalon M, PA-C.   Allergies  Allergen Reactions  . Molds & Smuts Shortness Of Breath and Other (See Comments)    (Mold and mildew) both FLARE THE PATIENT'S ASTHMA   Past Medical History:  Diagnosis Date  . 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 Asthma   . Bicuspid aortic  valve 11/19/2016  . Chest pain 11/19/2016  . LV dysfunction 11/19/2016  . Pneumonia 11/19/2016  . Substance abuse (HCC) 11/19/2016   Family History  Problem Relation Age of Onset  . Cancer Mother    Past Surgical History:  Procedure Laterality Date  . TEE WITHOUT CARDIOVERSION N/A 12/15/2016   Procedure: TRANSESOPHAGEAL ECHOCARDIOGRAM (TEE) WITH ANESTHESIA;  Surgeon: Vesta MixerNahser, Philip J, MD;  [facility];  Service: Cardiovascular;  Laterality: N/A;   Social History   Socioeconomic History  . Marital status: Single    Spouse name: Not on file  . Number of children:  Not on file  . Years of education: Not on file  . Highest education level: Not on file  Social Needs  . Financial resource strain: Not on file  . Food insecurity - worry: Not on file  . Food insecurity - inability: Not on file  . Transportation needs - medical: Not on file  . Transportation needs - non-medical: Not on file  Occupational History  . Not on file  Tobacco Use  . Smoking status: Current Every Day Smoker    Packs/day: 1.00    Types: Cigarettes  . Smokeless tobacco: Never Used  Substance and Sexual Activity  . Alcohol use: Yes  . Drug use: Yes    Frequency: 5.0 times per week    Types: Marijuana, IV, Cocaine    Comment: last used herioin and xanax on 11/14/16, states last used crack last week  . Sexual activity: Not on file  Other Topics Concern  . Not on file  Social History Narrative  . Not on file     Review of Systems: General: negative for chills, fever, night sweats or weight changes.  Cardiovascular: negative for chest pain, dyspnea on exertion, edema, orthopnea, palpitations, paroxysmal nocturnal dyspnea or shortness of breath Dermatological: negative for rash Respiratory: negative for cough or wheezing Urologic: negative for hematuria Abdominal: negative for nausea, vomiting, diarrhea, bright red blood per rectum, melena, or hematemesis Neurologic: negative for visual changes, syncope, or dizziness All other systems reviewed and are otherwise negative except as noted above.   Physical Exam:  Blood pressure 122/64, pulse 68, resp. rate 16, height 5\' 11"  (1.803 m), weight 143 lb 12.8 oz (65.2 kg), SpO2 98 %.  General appearance: alert, cooperative and no distress Neck: no carotid bruit and no JVD Lungs: clear to auscultation bilaterally Heart: regular rate and rhythm, S1, S2 normal, no murmur, click, rub or gallop Extremities: extremities normal, atraumatic, no cyanosis or edema Pulses: 2+ and symmetric Skin: Skin color, texture, turgor normal. No  rashes or lesions Neurologic: Grossly normal  EKG NSR, RAD -- personally reviewed   ASSESSMENT AND PLAN:   1. Bicuspid Aortic Valve with Insuffiencey: TTE 10/2016 showed moderate to severe AI, however TEE 12/15/16 showed mild to moderate. Currently asymptomatic. Continue to monitor. Given his bicuspid valve, he will also be at risk for AS as he gets older.   2. LV Dysfunction: EF by TTE 10/2016 showed mildly reduced EF down to 45%, however LVF noted to be normal by f/u TEE. Continue Losartan. No BB given cocaine use.  3. Polysubstance Use: admits to still using cocaine. I informed him of the dangerous risk of cocaine use including MI and cardiac arrest/SCD. I encouraged him to quit. I recommended counseling but he is not interested at this time. Also uses tobacco and ETOH.   4. Palpitations: currently asymptomatic but pt notes he has this frequently. No syncope. He drinks a lot  of caffeine, which he was encouraged to reduce. He also continues to use cocaine, which was also recommended that he stop. These factors both likely contributing. Not a candidate for BBs given cocaine use.    Follow-Up w/ Dr. Delton See in 3-4 months.   Venba Zenner Delmer Islam, MHS [facility] 01/21/2017 3:21 PM

## 2017-01-21 NOTE — Patient Instructions (Signed)
Medication Instructions:  Your physician recommends that you continue on your current medications as directed. Please refer to the Current Medication list given to you today.   Labwork: NONE ORDERED TODAY  Testing/Procedures: NONE ORDERED TODAY  Follow-Up: DR. Delton SeeNELSON April 29, 2017 @ 3 PM  Any Other Special Instructions Will Be Listed Below (If Applicable).     If you need a refill on your cardiac medications before your next appointment, please call your pharmacy.

## 2017-04-29 ENCOUNTER — Encounter: Payer: Self-pay | Admitting: Cardiology

## 2017-04-29 ENCOUNTER — Ambulatory Visit (INDEPENDENT_AMBULATORY_CARE_PROVIDER_SITE_OTHER): Payer: Self-pay | Admitting: Cardiology

## 2017-04-29 VITALS — BP 108/64 | HR 57 | Ht 71.0 in | Wt 140.0 lb

## 2017-04-29 DIAGNOSIS — I351 Nonrheumatic aortic (valve) insufficiency: Secondary | ICD-10-CM

## 2017-04-29 DIAGNOSIS — F1910 Other psychoactive substance abuse, uncomplicated: Secondary | ICD-10-CM

## 2017-04-29 DIAGNOSIS — Q231 Congenital insufficiency of aortic valve: Secondary | ICD-10-CM

## 2017-04-29 NOTE — Progress Notes (Signed)
04/29/2017 Morrell Riddleavid M Fehringer Jr.   1985-09-02  [id number]  Primary Physician Patient, No Pcp Per Primary Cardiologist: Dr. Delton SeeNelson   Reason for Visit/CC: F/u for LV Dysfunction, bicuspid aortic valve and aortic insuffiency  HPI:  Onalee HuaDavid is a 32 yo male w/ history of ETOH, drug and tobacco use who recently presented to ED in September with R sided chest pain, worse w/ deep inspiration and movement of right arm. Pain also worse with palpation of chest wall.He has had years of crack cocaine use. Also with ETOH and recently tried IV heroin. He woke 11/17/16 with Rt sides chest pain. No early FH of CAD. He did fall the day prior with the drug use. He was also "out" due to the drugs.   In the ER his troponin was positive with peak at 1.01. He was admitted for further w/u. Troponin were cycled and continued to trend downward. He was seen by Dr. Delton SeeNelson. CK was elevated at 13.4. 2D echo showed mildly reduced LVEF at 45%-50% with mild diffuse hypokinesis. Aortic valve felt to be bicuspid with moderate to severe AI. Pt also had cardiac CT neg for dissection and no PE. He was noted to have Rt LL infiltrate consistent with pneumonia. He was started on antibiotics. Augmentin BID x 1 week. If this is an aspiration pneumonia - Augmentin would cover anaerobes as well.For his substance abuse he was placed on ativan for withdrawal but only took one dose. Colchicine was added for pain but with dx of PNA this was stopped. He was placed on Losartan for LV dysfunction and HTN.   Dr. Delton SeeNelson felt that his pain was musculoskeletal 2/2 fall w/ CK level of 13. It was felt that his troponin elevation was 2/2 drug use, ? Vasospasm from cocaine/ demand ischemia.The plan is for pt to follow up after ABX and then plan TEE to better assess his aortic valve. The patient was discharged home on 11/19/16.   I saw him for post hospital f/u on 12/07/16 and was doing better. He was set up for TEE on 12/15/16 w/ Dr. Elease HashimotoNahser. This showed  "normal" systolic function. No EF was noted on report. Aortic valve noted to be bicuspid but AI was noted to be less severe compared to previous TTE report, now considered mild to moderate.   Today in clinic he is asymptomatic but notes that he often has palpitations. He drinks green tea often and was also using [employer] but has cut back. He admits that he continues to use cocaine, but not as frequently as before. Prior to his hospitalization, he was using every day. Now just a few times a months. Still smokes and drinks alcohol.   TEE 12/15/16 Study Conclusions  - Left ventricle: The cavity size was normal. Wall thickness was   normal. Systolic function was normal. - Aortic valve: Bicuspid. There was mild to moderate regurgitation. - Mitral valve: No evidence of vegetation. - Left atrium: No evidence of thrombus in the atrial cavity or   appendage. - Tricuspid valve: No evidence of vegetation.  04/29/2017 - the patient is coming after 3 months, he continues to use drugs including cocaine, he continues to smoke. He hasn't noticed worsening dyspnea on exertion but complains of night sweats that has been going on for the last year. No fevers. He has second lasting palpitations. He also complains of intermittent all 4 extremities numbness that are followed by migraines and blurred vision. This has been going on since he was 15.  Current Meds  Medication Sig  . CRANBERRY PO Take 1 tablet by mouth daily.  . folic acid (FOLVITE) 1 MG tablet Take 1 tablet (1 mg total) by mouth daily.  Marland Kitchen losartan (COZAAR) 25 MG tablet Take 1 tablet (25 mg total) by mouth daily.  . multivitamin (ONE-A-DAY MEN'S) TABS tablet Take 1 tablet by mouth daily.  . Omega-3 Fatty Acids (FISH OIL PO) Take 1 capsule by mouth daily.   Allergies  Allergen Reactions  . Molds & Smuts Shortness Of Breath and Other (See Comments)    (Mold and mildew) both FLARE THE PATIENT'S ASTHMA   Past Medical History:  Diagnosis  Date  . 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 Asthma   . Bicuspid aortic valve 11/19/2016  . Chest pain 11/19/2016  . LV dysfunction 11/19/2016  . Pneumonia 11/19/2016  . Substance abuse (HCC) 11/19/2016   Family History  Problem Relation Age of Onset  . Cancer Mother    Past Surgical History:  Procedure Laterality Date  . TEE WITHOUT CARDIOVERSION N/A 12/15/2016   Procedure: TRANSESOPHAGEAL ECHOCARDIOGRAM (TEE) WITH ANESTHESIA;  Surgeon: Vesta Mixer, MD;  Location: [facility] ENDOSCOPY;  Service: Cardiovascular;  Laterality: N/A;   Social History   Socioeconomic History  . Marital status: Single    Spouse name: Not on file  . Number of children: Not on file  . Years of education: Not on file  . Highest education level: Not on file  Social Needs  . Financial resource strain: Not on file  . Food insecurity - worry: Not on file  . Food insecurity - inability: Not on file  . Transportation needs - medical: Not on file  . Transportation needs - non-medical: Not on file  Occupational History  . Not on file  Tobacco Use  . Smoking status: Current Every Day Smoker    Packs/day: 1.00    Types: Cigarettes  . Smokeless tobacco: Never Used  Substance and Sexual Activity  . Alcohol use: Yes  . Drug use: Yes    Frequency: 5.0 times per week    Types: Marijuana, IV, Cocaine    Comment: last used herioin and xanax on 11/14/16, states last used crack last week  . Sexual activity: Not on file  Other Topics Concern  . Not on file  Social History Narrative  . Not on file     Review of Systems: General: negative for chills, fever, night sweats or weight changes.  Cardiovascular: negative for chest pain, dyspnea on exertion, edema, orthopnea, palpitations, paroxysmal nocturnal dyspnea or shortness of breath Dermatological: negative for rash Respiratory: negative for cough or wheezing Urologic: negative for hematuria Abdominal: negative for nausea, vomiting,  diarrhea, bright red blood per rectum, melena, or hematemesis Neurologic: negative for visual changes, syncope, or dizziness All other systems reviewed and are otherwise negative except as noted above.   Physical Exam:  Blood pressure 108/64, pulse (!) 57, height 5\' 11"  (1.803 m), weight 140 lb (63.5 kg), SpO2 99 %.  General appearance: alert, cooperative and no distress Neck: no carotid bruit and no JVD Lungs: clear to auscultation bilaterally Heart: regular rate and rhythm, S1, S2 normal, no murmur, click, rub or gallop Extremities: extremities normal, atraumatic, no cyanosis or edema Pulses: 2+ and symmetric Skin: Skin color, texture, turgor normal. No rashes or lesions Neurologic: Grossly normal  EKG NSR, RAD -- personally reviewed     ASSESSMENT AND PLAN:   1. Bicuspid Aortic Valve with Insuffiencey: TTE 10/2016 showed  moderate to severe AI, however TEE 12/15/16 showed mild to moderate. Currently asymptomatic. Continue to monitor. Given his bicuspid valve, he will also be at risk for AS as he gets older. We will repeat again in a year from his original echo.  2. LV Dysfunction: EF by TTE 10/2016 showed mildly reduced EF down to 45%, however LVF noted to be normal by f/u TEE. Continue Losartan. No BB given cocaine use.  3. Polysubstance Use: admits to still using cocaine. I informed him of the dangerous risk of cocaine use including MI and cardiac arrest/SCD. He has poor insight, he feels he is doing great by not using cocaine everyday anymore, doesn't feel totally motivated to quit smoking.  4. Palpitations: currently asymptomatic but pt notes he has this frequently. No syncope. He drinks a lot of caffeine, which he was encouraged to reduce. He also continues to use cocaine, which was also recommended that he stop. These factors both likely contributing. Not a candidate for BBs given cocaine use.    Follow-Up w/ Dr. Delton See in 6 months, echo prior to that appointment.   Tobias Alexander PA-C, [facility] 04/29/2017 3:41 PM

## 2017-04-29 NOTE — Patient Instructions (Signed)
Medication Instructions:   Your physician recommends that you continue on your current medications as directed. Please refer to the Current Medication list given to you today.    Testing/Procedures:  Your physician has requested that you have an echocardiogram. Echocardiography is a painless test that uses sound waves to create images of your heart. It provides your doctor with information about the size and shape of your heart and how well your heart's chambers and valves are working. This procedure takes approximately one hour. There are no restrictions for this procedure. PLEASE SCHEDULED THIS A WEEK PRIOR TO YOUR 6 MONTH FOLLOW-UP APPOINTMENT WITH DR Delton SeeNELSON     Follow-Up:  Your physician wants you to follow-up in: 6  MONTHS WITH DR Johnell ComingsNELSON You will receive a reminder letter in the mail two months in advance. If you don't receive a letter, please call our office to schedule the follow-up appointment.  PLEASE HAVE YOUR ECHO DONE A WEEK PRIOR TO THIS OFFICE VISIT        If you need a refill on your cardiac medications before your next appointment, please call your pharmacy.

## 2017-10-27 ENCOUNTER — Encounter (HOSPITAL_COMMUNITY): Payer: Self-pay

## 2017-10-27 ENCOUNTER — Other Ambulatory Visit: Payer: Self-pay

## 2017-10-27 ENCOUNTER — Emergency Department (HOSPITAL_COMMUNITY)
Admission: EM | Admit: 2017-10-27 | Discharge: 2017-10-27 | Disposition: A | Discharge status: Home / Self Care | Payer: Self-pay | Attending: Emergency Medicine | Admitting: Emergency Medicine

## 2017-10-27 DIAGNOSIS — F10120 Alcohol abuse with intoxication, uncomplicated: Secondary | ICD-10-CM | POA: Insufficient documentation

## 2017-10-27 DIAGNOSIS — J45909 Unspecified asthma, uncomplicated: Secondary | ICD-10-CM | POA: Insufficient documentation

## 2017-10-27 DIAGNOSIS — F10920 Alcohol use, unspecified with intoxication, uncomplicated: Secondary | ICD-10-CM

## 2017-10-27 DIAGNOSIS — F17210 Nicotine dependence, cigarettes, uncomplicated: Secondary | ICD-10-CM | POA: Insufficient documentation

## 2017-10-27 LAB — CBG MONITORING, ED: GLUCOSE-CAPILLARY: 124 mg/dL — AB (ref 70–99)

## 2017-10-27 LAB — I-STAT TROPONIN, ED: TROPONIN I, POC: 0.03 ng/mL (ref 0.00–0.08)

## 2017-10-27 NOTE — ED Notes (Signed)
Patient did not want discharge paperwork,did not want to sign-Father at bedside to drive patient home.

## 2017-10-27 NOTE — ED Notes (Signed)
Patient states he does not know why he is here-[employer] states he was passed out in his yard after drinking-patient states he does not want to be here-patient is loud and cursing-placed in hall bed for MSE.

## 2017-10-27 NOTE — ED Triage Notes (Addendum)
Per EMS- Patient has ETOH on board. Patient had a CBG- 62 prior to arrival to the ED Instant glucose given and CBG increased to 69.  Patient was found unconscious in his yard. [employer] came with EMS upon arrival tot he ED.

## 2017-10-27 NOTE — ED Provider Notes (Signed)
MSE was initiated and I personally evaluated the patient and placed orders (if any) at  7:27 PM on October 27, 2017.  Paul Malvin. is a 32 y.o. male who presents to the ED via EMS for chest pain and dizziness. Patient has hx of chest pain with elevated troponin, aortic insufficiency due to Bicuspid Aortic Valve. Patient is scheduled for Transesophageal Echocardiogram as a surgical intervention.   BP 108/66 (BP Location: Right Arm)   Pulse 71   Temp 98.4 F (36.9 C) (Oral)   Resp 16   Ht 5\' 11"  (1.803 m)   Wt 68 kg   SpO2 98%   BMI 20.92 kg/m   Patient is intoxicated and saying he is going to leave followed by saying he is going to stay because he doesn't want to go out and die. Patient is alert and answers questions but then starts swearing and getting loud. Police officers are with the patient and report that the patient's father is here.   The patient appears stable so that the remainder of the MSE may be completed by another provider.   [name], [place] 10/27/17 [name]    [name], MD 10/27/17 2029

## 2017-10-27 NOTE — ED Provider Notes (Signed)
[facility]-EMERGENCY DEPT Provider Note   CSN: [id number] Arrival date & time: 10/27/17  1827     History   Chief Complaint Chief Complaint  Patient presents with  . Alcohol Intoxication    HPI Paul Mcdowell. is a 32 y.o. male.  HPI Patient presented to the emergency room for evaluation of alcohol intoxication.  Patient has a history of substance abuse.  According to the EMS report the patient was out in his yard drinking alcohol.  Neighbors called because they found him passed out in the yard.  Patient became belligerent and the police were also contacted.  Initially in the ED the patient mentioned about having cardiac issues and not wanting to die.  I was able to review the patient's medical records and he has a history of a bicuspid aortic valve.  He was recently admitted to the hospital when he had elevated cardiac enzymes but his subsequent cardiac evaluation was reassuring.  Patient denies any complaints right now.  He admits to drinking alcohol.  He states he wants to go home.  His family member is here with him and is ready to drive him home. Past Medical History:  Diagnosis Date  . 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 Asthma   . Bicuspid aortic valve 11/19/2016  . Chest pain 11/19/2016  . LV dysfunction 11/19/2016  . Pneumonia 11/19/2016  . Substance abuse (HCC) 11/19/2016    Patient Active Problem List   Diagnosis Date Noted  . Bicuspid aortic valve 11/19/2016  . 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 Pneumonia 11/19/2016  . Chest pain 11/19/2016  . Substance abuse (HCC) 11/19/2016  . LV dysfunction 11/19/2016  . Elevated troponin 11/17/2016    Past Surgical History:  Procedure Laterality Date  . TEE WITHOUT CARDIOVERSION N/A 12/15/2016   Procedure: TRANSESOPHAGEAL ECHOCARDIOGRAM (TEE) WITH ANESTHESIA;  Surgeon: Elease Hashimoto Deloris Ping, MD;  Location: [facility] ENDOSCOPY;  Service: Cardiovascular;   Laterality: N/A;        Home Medications    Prior to Admission medications   Medication Sig Start Date End Date Taking? Authorizing Provider  CRANBERRY PO Take 1 tablet by mouth daily.    [provider]  folic acid (FOLVITE) 1 MG tablet Take 1 tablet (1 mg total) by mouth daily. 01/21/17   Robbie Lis M, PA-C  losartan (COZAAR) 25 MG tablet Take 1 tablet (25 mg total) by mouth daily. 01/21/17   Robbie Lis M, PA-C  multivitamin (ONE-A-DAY MEN'S) TABS tablet Take 1 tablet by mouth daily. 01/21/17   Robbie Lis M, PA-C  Omega-3 Fatty Acids (FISH OIL PO) Take 1 capsule by mouth daily.    [provider]    Family History Family History  Problem Relation Age of Onset  . Cancer Mother     Social History Social History   Tobacco Use  . Smoking status: Current Every Day Smoker    Packs/day: 1.00    Types: Cigarettes  . Smokeless tobacco: Never Used  Substance Use Topics  . Alcohol use: Yes    Comment: daily use-6 pack daily and moonshine  . Drug use: Yes    Frequency: 5.0 times per week    Types: Marijuana, IV     Allergies   Molds & smuts   Review of Systems Review of Systems  All other systems reviewed and are negative.    Physical Exam Updated Vital Signs BP 108/66 (BP Location: Right Arm)  Pulse 71   Temp 98.4 F (36.9 C) (Oral)   Resp 16   Ht 1.803 m (5\' 11" )   Wt 68 kg   SpO2 98%   BMI 20.92 kg/m   Physical Exam  Constitutional: No distress.  HENT:  Head: Normocephalic and atraumatic.  Right Ear: External ear normal.  Left Ear: External ear normal.  Eyes: Conjunctivae are normal. Right eye exhibits no discharge. Left eye exhibits no discharge. No scleral icterus.  Neck: Neck supple. No tracheal deviation present.  Cardiovascular: Normal rate, regular rhythm and intact distal pulses.  Pulmonary/Chest: Effort normal and breath sounds normal. No stridor. No respiratory distress. He has no wheezes. He has no  rales.  Abdominal: Soft. Bowel sounds are normal. He exhibits no distension. There is no tenderness. There is no rebound and no guarding.  Musculoskeletal: He exhibits no edema or tenderness.  Neurological: He is alert. He has normal strength. No cranial nerve deficit (no facial droop, extraocular movements intact, no slurred speech) or sensory deficit. He exhibits normal muscle tone. He displays no seizure activity. Coordination normal.  Speech is mildly slurred  Skin: Skin is warm and dry. No rash noted. He is not diaphoretic.  Psychiatric: He has a normal mood and affect.  Nursing note and vitals reviewed.    ED Treatments / Results  Labs (all labs ordered are listed, but only abnormal results are displayed) Labs Reviewed  CBG MONITORING, ED - Abnormal; Notable for the following components:      Result Value   Glucose-Capillary 124 (*)    All other components within normal limits  I-STAT TROPONIN, ED    EKG None  Radiology No results found.  Procedures Procedures (including critical care time)  Medications Ordered in ED Medications - No data to display   Initial Impression / Assessment and Plan / ED Course  I have reviewed the triage vital signs and the nursing notes.  Pertinent labs & imaging results that were available during my care of the patient were reviewed by me and considered in my medical decision making (see chart for details).  Clinical Course as of Oct 28 2030  Thu Oct 27, 2017  2029 EKGSinus bradycardia rate 57Right axisNormal ST T wavesNo prior EKG for comparison   [JK]    Clinical Course User Index [JK] [name], MD    Patient appears intoxicated but he is able to answer my questions appropriately.  He is able to sit up in bed without any difficulty.  His family members are here with him and plan on taking him home. At this time there does not appear to be any evidence of an acute emergency medical condition and the patient appears stable for  discharge with appropriate outpatient follow up.   Final Clinical Impressions(s) / ED Diagnoses   Final diagnoses:  Alcoholic intoxication without complication [facility])    ED Discharge Orders    None       [name], MD 10/27/17 2036

## 2017-10-31 ENCOUNTER — Other Ambulatory Visit (HOSPITAL_COMMUNITY): Payer: Self-pay

## 2017-11-21 ENCOUNTER — Encounter: Payer: Self-pay | Admitting: Cardiology

## 2017-11-25 ENCOUNTER — Telehealth: Payer: Self-pay

## 2017-11-25 NOTE — Telephone Encounter (Signed)
New message    Just an FYI. We have made several attempts to contact this patient including sending a letter to schedule or reschedule their echocardiogram. We will be removing the patient from the echo WQ.   Thank you 

## 2018-02-18 ENCOUNTER — Other Ambulatory Visit: Payer: Self-pay | Admitting: Cardiology

## 2018-06-02 ENCOUNTER — Other Ambulatory Visit: Payer: Self-pay | Admitting: Cardiology

## 2018-06-05 ENCOUNTER — Other Ambulatory Visit: Payer: Self-pay | Admitting: Cardiology

## 2018-06-05 MED ORDER — OLMESARTAN MEDOXOMIL 20 MG PO TABS: ORAL_TABLET | ORAL | 1 refills | Status: DC

## 2018-06-05 NOTE — Telephone Encounter (Signed)
Pt medication was changed from losartan to olmesartan. This medication does not have the direction in the sig on how the pt is supposed to be taking this medication. [facility] requesting clarification on how pt is supposed to be taking this medication. Please address

## 2018-06-20 ENCOUNTER — Other Ambulatory Visit: Payer: Self-pay | Admitting: Cardiology

## 2018-06-21 MED ORDER — OLMESARTAN MEDOXOMIL 20 MG PO TABS: ORAL_TABLET | ORAL | 1 refills | Status: AC

## 2018-06-21 NOTE — Addendum Note (Signed)
Addended byClaiborne Rigg on: 06/21/2018 12:57 PM   Modules accepted: Orders

## 2019-01-20 ENCOUNTER — Emergency Department (HOSPITAL_COMMUNITY): Payer: Self-pay

## 2019-01-20 ENCOUNTER — Emergency Department (HOSPITAL_COMMUNITY)
Admission: EM | Admit: 2019-01-20 | Discharge: 2019-01-20 | Disposition: A | Discharge status: Home / Self Care | Payer: Self-pay | Attending: Emergency Medicine | Admitting: Emergency Medicine

## 2019-01-20 ENCOUNTER — Other Ambulatory Visit: Payer: Self-pay

## 2019-01-20 DIAGNOSIS — Y9389 Activity, other specified: Secondary | ICD-10-CM | POA: Insufficient documentation

## 2019-01-20 DIAGNOSIS — S0990XA Unspecified injury of head, initial encounter: Secondary | ICD-10-CM | POA: Insufficient documentation

## 2019-01-20 DIAGNOSIS — F17210 Nicotine dependence, cigarettes, uncomplicated: Secondary | ICD-10-CM | POA: Insufficient documentation

## 2019-01-20 DIAGNOSIS — Y929 Unspecified place or not applicable: Secondary | ICD-10-CM | POA: Insufficient documentation

## 2019-01-20 DIAGNOSIS — Z79899 Other long term (current) drug therapy: Secondary | ICD-10-CM | POA: Insufficient documentation

## 2019-01-20 DIAGNOSIS — H05222 Edema of left orbit: Secondary | ICD-10-CM | POA: Insufficient documentation

## 2019-01-20 DIAGNOSIS — Y999 Unspecified external cause status: Secondary | ICD-10-CM | POA: Insufficient documentation

## 2019-01-20 DIAGNOSIS — R040 Epistaxis: Secondary | ICD-10-CM | POA: Insufficient documentation

## 2019-01-20 DIAGNOSIS — H5789 Other specified disorders of eye and adnexa: Secondary | ICD-10-CM

## 2019-01-20 MED ORDER — PHENYLEPHRINE HCL 0.5 % NA SOLN
1.0000 [drp] | Freq: Once | NASAL | Status: AC
  Administered 2019-01-20: 20:00:00 1 [drp] via NASAL
  Filled 2019-01-20: qty 15

## 2019-01-20 MED ORDER — ACETAMINOPHEN 500 MG PO TABS
1000.0000 mg | ORAL_TABLET | Freq: Once | ORAL | Status: AC
  Administered 2019-01-20: 18:00:00 1000 mg via ORAL
  Filled 2019-01-20: qty 2

## 2019-01-20 NOTE — Discharge Instructions (Addendum)
You were examined today for a head injury and possible concussion.  The CT scans of your head neck and face were very reassuring, the blood is likely coming from your left sinus, you can use phenylephrine spray in the nostril and then hold pressure if you start having any bleeding.  This should improve over the next few days.  Please try to avoid any nose blowing as this can worsen bleeding.  Apply ice to the areas of swelling over your face and use Motrin and Tylenol as needed for pain.  If symptoms suggest you may also have a mild concussion.  Please follow-up with the concussion clinic.  Follow the recommendations provided below.  Sometimes serious problems can develop after a head injury. Please return to the emergency department if you experience any of the following symptoms: Repeated vomiting Headache that gets worse and does not go away Loss of consciousness or inability to stay awake at times when you normally would be able to Getting more confused, restless or agitated Convulsions or seizures Difficulty walking or feeling off balance Weakness or numbness Vision changes A concussion is a very mild traumatic brain injury caused by a bump, jolt or blow to the head, most people recover quickly and fully. You can experience a wide variety of symptoms including:   - Confusion      - Difficulty concentrating       - Trouble remembering new info  - Headache      - Dizziness        - Fuzzy or blurry vision  - Fatigue      - Balance problems      - Light sensitivity  - Mood swings     - Changes in sleep or difficulty sleeping   To help these symptoms improve make sure you are getting plenty of rest, avoid screen time, loud music and strenuous mental activities. Avoid any strenuous physical activities, once your symptoms have resolved a slow and gradual return to activity is recommended. It is very important that you avoid situations in which you might sustain a second head injury as this can be  very dangerous and life threatening. You cannot be medically cleared to return to normal activities until you have followed up with your primary doctor or a concussion specialist for reevaluation.
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
## 2019-01-20 NOTE — ED Notes (Signed)
Patient verbalizes understanding of discharge instructions. Opportunity for questioning and answers were provided. Armband removed by staff, pt discharged from ED.  
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
## 2019-07-02 ENCOUNTER — Encounter: Payer: Self-pay | Admitting: General Practice

## 2020-03-14 DIAGNOSIS — K029 Dental caries, unspecified: Secondary | ICD-10-CM | POA: Insufficient documentation

## 2020-03-14 DIAGNOSIS — J45909 Unspecified asthma, uncomplicated: Secondary | ICD-10-CM | POA: Insufficient documentation

## 2020-03-14 DIAGNOSIS — F17210 Nicotine dependence, cigarettes, uncomplicated: Secondary | ICD-10-CM | POA: Insufficient documentation

## 2020-03-15 ENCOUNTER — Telehealth: Payer: Self-pay

## 2020-03-15 ENCOUNTER — Emergency Department (HOSPITAL_COMMUNITY)
Admission: EM | Admit: 2020-03-15 | Discharge: 2020-03-15 | Disposition: A | Discharge status: Home / Self Care | Payer: Self-pay | Attending: Emergency Medicine | Admitting: Emergency Medicine

## 2020-03-15 ENCOUNTER — Encounter (HOSPITAL_COMMUNITY): Payer: Self-pay | Admitting: Emergency Medicine

## 2020-03-15 ENCOUNTER — Other Ambulatory Visit: Payer: Self-pay

## 2020-03-15 DIAGNOSIS — K029 Dental caries, unspecified: Secondary | ICD-10-CM

## 2020-03-15 MED ORDER — AMOXICILLIN 500 MG PO CAPS: 500.0000 mg | ORAL_CAPSULE | Freq: Three times a day (TID) | ORAL | 0 refills | Status: AC

## 2020-03-15 MED ORDER — AMOXICILLIN 500 MG PO CAPS
1000.0000 mg | ORAL_CAPSULE | Freq: Once | ORAL | Status: AC
  Administered 2020-03-15: 1000 mg via ORAL
  Filled 2020-03-15: qty 2

## 2020-03-15 NOTE — Telephone Encounter (Signed)
Patient called in and stated the pharmacy that his antibiotic got sent to was closed. Called in prescription for amoxacillin 500 mg three times a day sig 30 tabs as prescribed by Dr Oletta Cohn to [facility] patient called back to notify

## 2020-03-15 NOTE — ED Provider Notes (Signed)
[facility] EMERGENCY DEPARTMENT Provider Note   CSN: [id number] Arrival date & time: 03/14/20  2359     History Chief Complaint  Patient presents with  . Dental Pain    Paul Mcdowell. is a 35 y.o. male.   Dental Pain Location:  Upper and lower (right) Quality:  Aching and constant Severity:  Severe Onset quality:  Gradual Duration:  1 month Timing:  Constant Progression:  Worsening Chronicity:  Recurrent Context: dental caries and poor dentition   Relieved by: initially relieved by OTC pain killers, but now not working. Associated symptoms: no fever        Past Medical History:  Diagnosis Date  . 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 Asthma   . Bicuspid aortic valve 11/19/2016  . Chest pain 11/19/2016  . LV dysfunction 11/19/2016  . Pneumonia 11/19/2016  . Substance abuse ([facility]) 11/19/2016    Patient Active Problem List   Diagnosis Date Noted  . Bicuspid aortic valve 11/19/2016  . 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 Pneumonia 11/19/2016  . Chest pain 11/19/2016  . Substance abuse ([facility]) 11/19/2016  . LV dysfunction 11/19/2016  . Elevated troponin 11/17/2016    Past Surgical History:  Procedure Laterality Date  . TEE WITHOUT CARDIOVERSION N/A 12/15/2016   Procedure: TRANSESOPHAGEAL ECHOCARDIOGRAM (TEE) WITH ANESTHESIA;  Surgeon: Vesta Mixer, MD;  Location: [facility] ENDOSCOPY;  Service: Cardiovascular;  Laterality: N/A;       Family History  Problem Relation Age of Onset  . Cancer Mother     Social History   Tobacco Use  . Smoking status: Current Every Day Smoker    Packs/day: 1.00    Types: Cigarettes  . Smokeless tobacco: Never Used  Vaping Use  . Vaping Use: Every day  Substance Use Topics  . Alcohol use: Yes    Comment: daily use-6 pack daily and moonshine  . Drug use: Yes    Frequency: 5.0 times per week    Types: Marijuana, IV    Home Medications Prior to  Admission medications   Medication Sig Start Date End Date Taking? Authorizing Provider  amoxicillin (AMOXIL) 500 MG capsule Take 1 capsule (500 mg total) by mouth 3 (three) times daily. 03/15/20  Yes Kalyn Dimattia, Canary Brim, MD  albuterol (PROVENTIL) (2.5 MG/3ML) 0.083% nebulizer solution Take 2.5 mg by nebulization every 6 (six) hours as needed for wheezing or shortness of breath.    [provider]  albuterol (VENTOLIN HFA) 108 (90 Base) MCG/ACT inhaler Inhale 1 puff into the lungs every 6 (six) hours as needed for wheezing or shortness of breath.    [provider]  diphenhydramine-acetaminophen (TYLENOL PM) 25-500 MG TABS tablet Take 2 tablets by mouth at bedtime as needed (pain).    [provider]  folic acid (FOLVITE) 1 MG tablet TAKE 1 TABLET BY MOUTH EVERY DAY Patient taking differently: Take 1 mg by mouth daily.  02/20/18   Robbie Lis M, PA-C  multivitamin (ONE-A-DAY MEN'S) TABS tablet Take 1 tablet by mouth daily. 01/21/17   Robbie Lis M, PA-C  olmesartan (BENICAR) 20 MG tablet TAKE 1 TABLET BY MOUTH EVERY DAY Patient taking differently: Take 20 mg by mouth daily.  06/21/18   Runell Gess, MD    Allergies    Molds & smuts  Review of Systems   Review of Systems  Constitutional: Negative for fever.  HENT: Positive for dental problem.     Physical  Exam Updated Vital Signs BP 132/78 (BP Location: Left Arm)   Pulse 65   Temp 98.3 F (36.8 C) (Oral)   Resp 16   SpO2 100%   Physical Exam Vitals and nursing note reviewed.  Constitutional:      Appearance: Normal appearance. He is obese.  HENT:     Mouth/Throat:     Dentition: Abnormal dentition. Dental tenderness and dental caries present. No gingival swelling or dental abscesses.  Pulmonary:     Effort: Pulmonary effort is normal.  Musculoskeletal:        General: Normal range of motion.  Skin:    General: Skin is warm and dry.     Findings: No erythema.  Neurological:      General: No focal deficit present.     Mental Status: He is alert.  Psychiatric:        Mood and Affect: Mood normal.     ED Results / Procedures / Treatments   Labs (all labs ordered are listed, but only abnormal results are displayed) Labs Reviewed - No data to display  EKG None  Radiology No results found.  Procedures Procedures (including critical care time)  Medications Ordered in ED Medications  amoxicillin (AMOXIL) capsule 1,000 mg (has no administration in time range)    ED Course  I have reviewed the triage vital signs and the nursing notes.  Pertinent labs & imaging results that were available during my care of the patient were reviewed by me and considered in my medical decision making (see chart for details).    MDM Rules/Calculators/A&P                          Dental pain without abscess Final Clinical Impression(s) / ED Diagnoses Final diagnoses:  Dental caries    Rx / DC Orders ED Discharge Orders         Ordered    amoxicillin (AMOXIL) 500 MG capsule  3 times daily        03/15/20 0020           Gilda Crease, MD 03/15/20 0020

## 2020-03-15 NOTE — ED Triage Notes (Signed)
Patient complaining of dental pain, upper and lower right jaw.  Patient has poor dentition.

## 2021-10-10 IMAGING — CT CT MAXILLOFACIAL W/O CM
5 of 15 series · 16 of 47 positions shown, 17 images · non-contrast
Comparison: Head CT 01/20/2019.

CLINICAL DATA: 33-year-old male with history of minor head trauma.

EXAM:
CT HEAD WITHOUT CONTRAST
CT MAXILLOFACIAL WITHOUT CONTRAST
CT CERVICAL SPINE WITHOUT CONTRAST
TECHNIQUE: Multidetector CT imaging of the head, cervical spine, and
maxillofacial structures were performed using the standard protocol
without intravenous contrast. Multiplanar CT image reconstructions
of the cervical spine and maxillofacial structures were also
generated.

[Series 8: facial/ orbits 2.0 h30s · axial · 0.41mm/px · z∈[+245,+329]mm · 3 of 85 slices shown]
[im 22/85  bone]
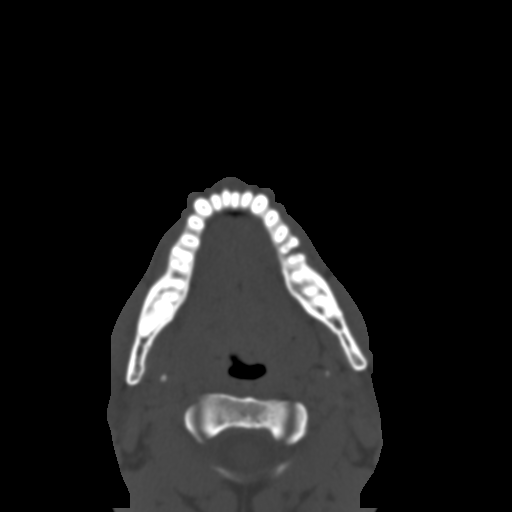
[im 43/85  bone]
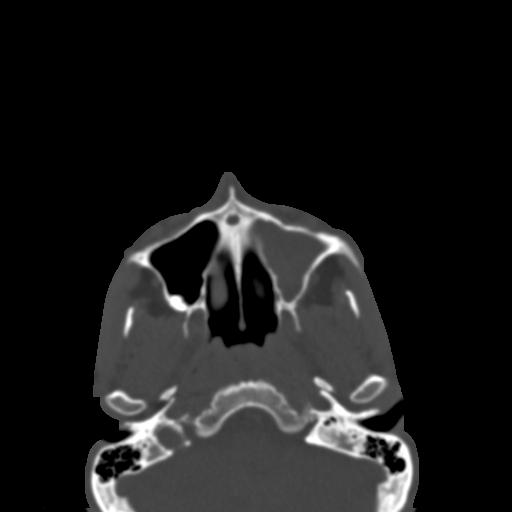
[im 64/85  bone]
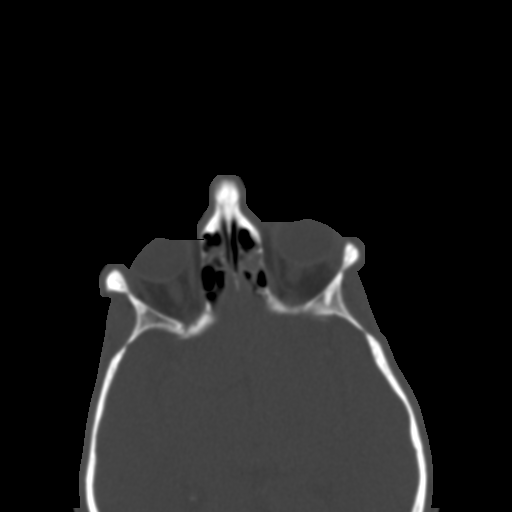

[Series 11: 1.0 thin soft tissue · axial · 0.39mm/px · z∈[+220,+295]mm · 5 of 170 slices shown]
[im 19/170  brain]
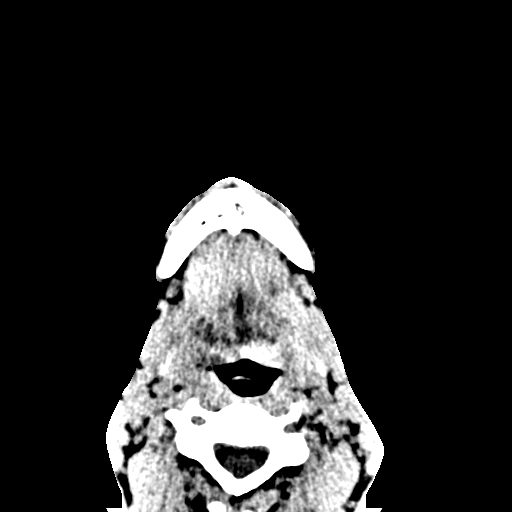
[im 38/170  brain]
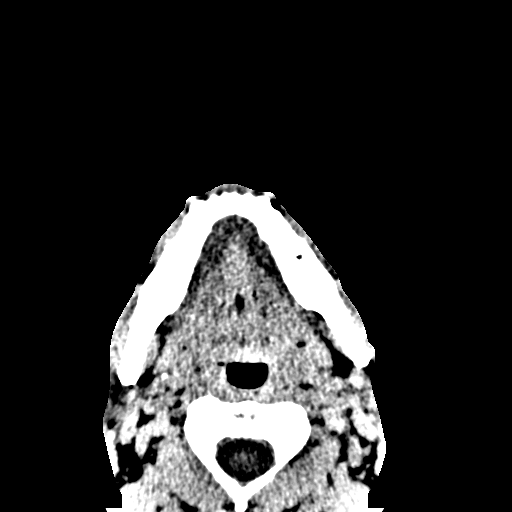
[im 57/170  brain]
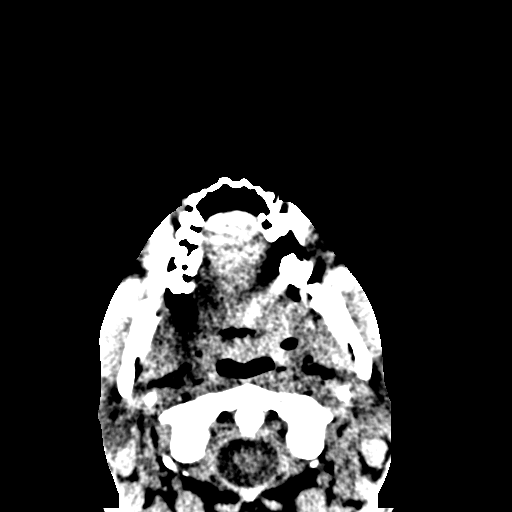
[im 76/170  brain]
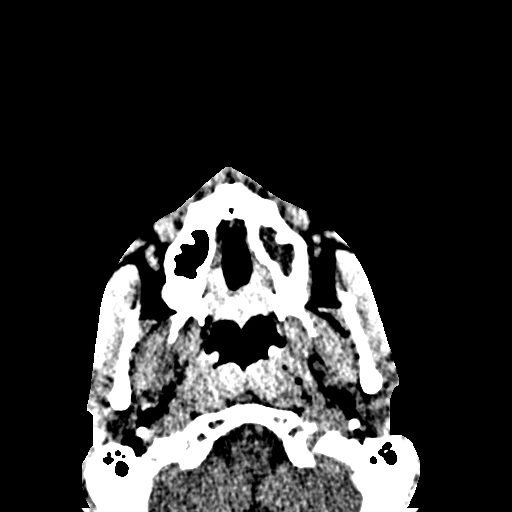
[im 94/170  brain]
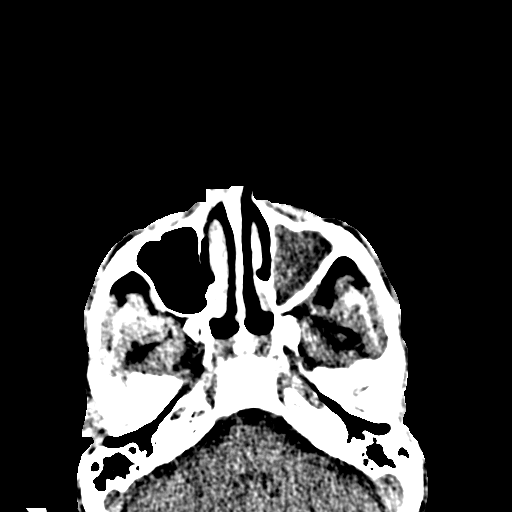

[Series 18: c_spine 2.0 3 st · axial · 0.30mm/px · z∈[+168,+260]mm · 3 of 92 slices shown, 4 images]
[im 23/92  brain]
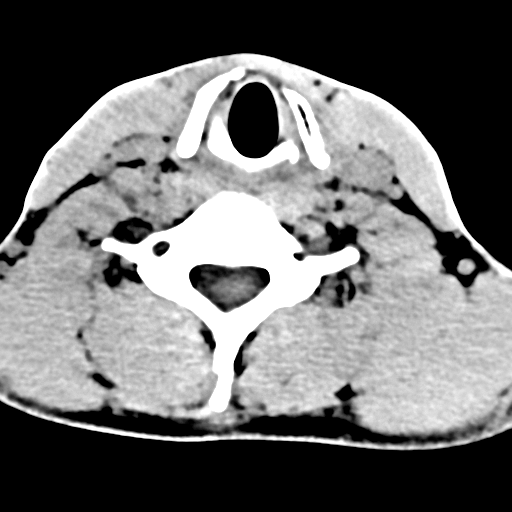
[im 23/92  bone]
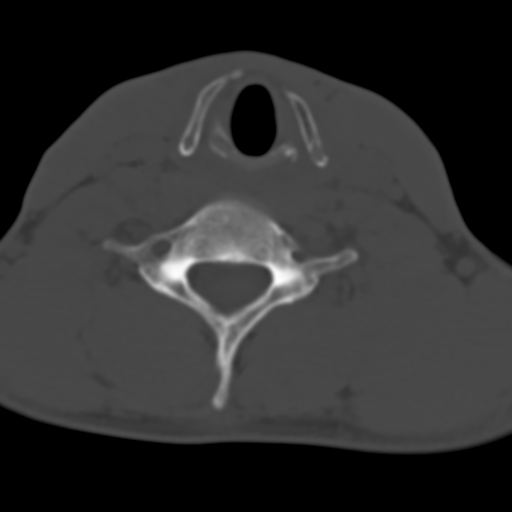
[im 46/92  bone]
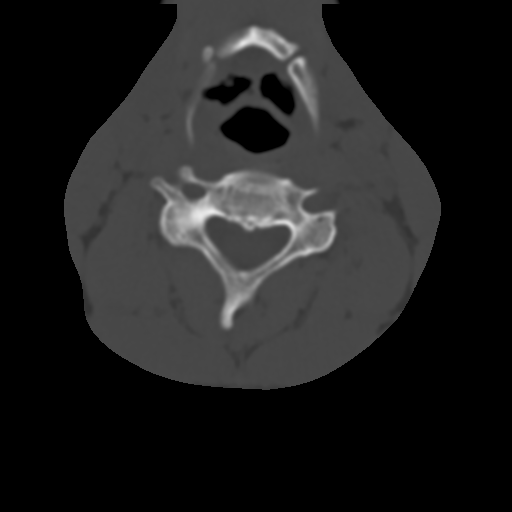
[im 69/92  bone]
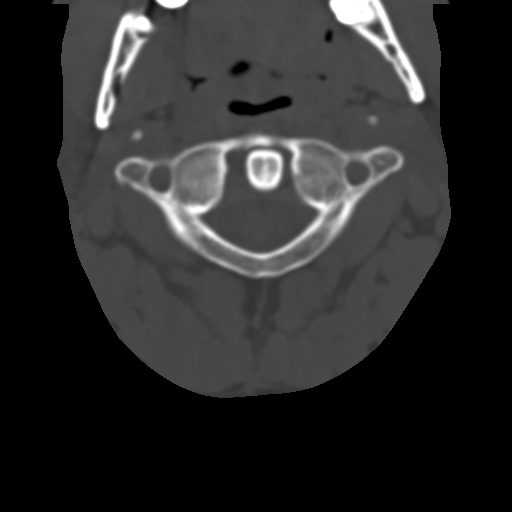

[Series 23: coronal bone · coronal · 0.23mm/px · 2 of 61 slices shown]
[im 21/61  bone]
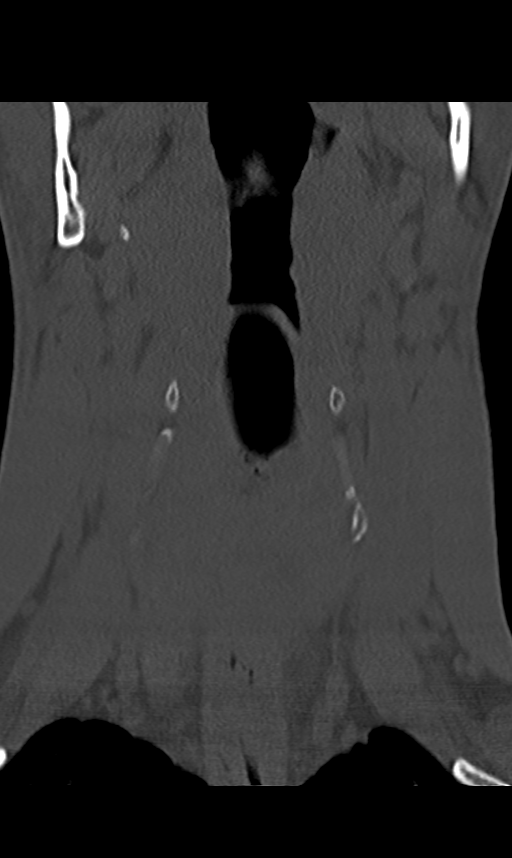
[im 41/61  bone]
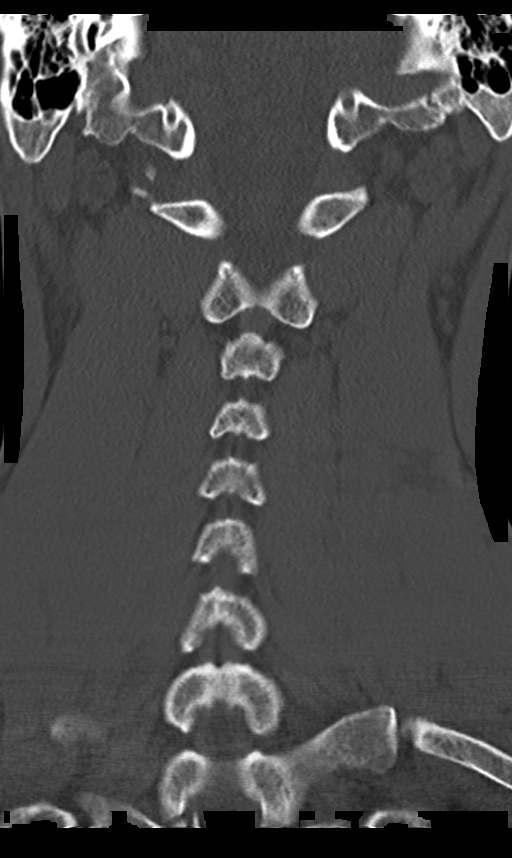

[Series 27: orthogonal axials st · axial · 0.21mm/px · z∈[+166,+249]mm · 3 of 80 slices shown]
[im 20/80  bone]
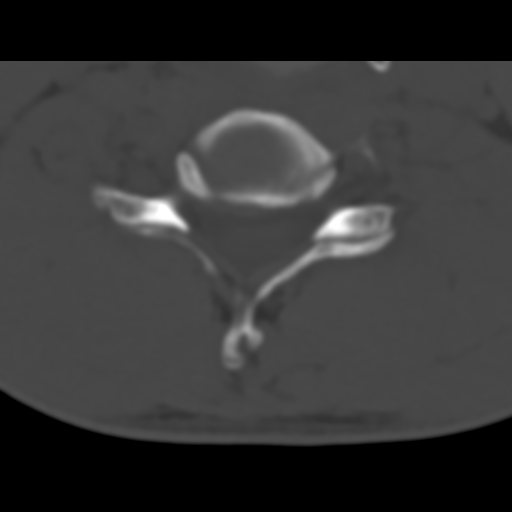
[im 40/80  bone]
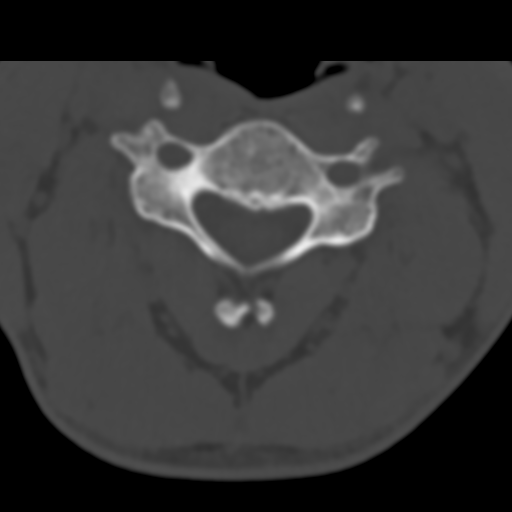
[im 60/80  bone]
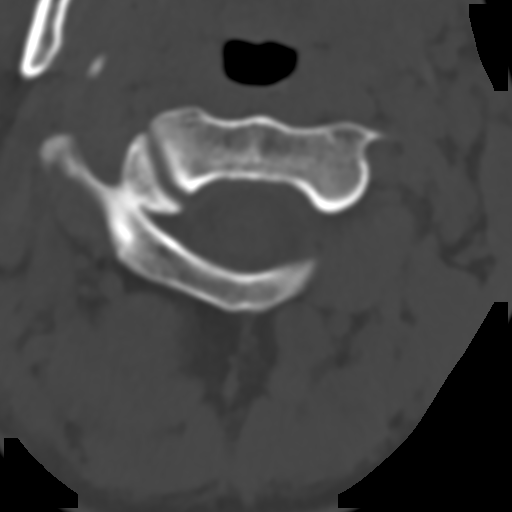

[16 of 47 positions shown; findings below may reference images not displayed]

FINDINGS: CT HEAD FINDINGS

Brain: No evidence of acute infarction, hemorrhage, hydrocephalus,
extra-axial collection or mass lesion/mass effect.

Vascular: No hyperdense vessel or unexpected calcification.

Skull: Normal. Negative for fracture or focal lesion.

Other: None.

CT MAXILLOFACIAL FINDINGS

Osseous: No fracture or mandibular dislocation. No destructive
process.

Orbits: Negative. No traumatic or inflammatory finding.

Sinuses: Extensive mucosal thickening throughout the left maxillary
sinus. Mild multifocal mucosal thickening in the left ethmoid
sinuses. No hemosinus.

Soft tissues: Negative.

CT CERVICAL SPINE FINDINGS

Alignment: Normal.

Skull base and vertebrae: No acute fracture. No primary bone lesion
or focal pathologic process.

Soft tissues and spinal canal: No prevertebral fluid or swelling. No
visible canal hematoma.

Disc levels: No significant degenerative disc disease or facet
arthropathy.

Upper chest: Negative.

Other: None.
IMPRESSION: 1. No evidence of significant acute traumatic injury to the skull,
facial bones, brain or cervical spine.
2. The appearance of the brain is normal.
3. Chronic paranasal sinus disease involving the left maxillary
sinus and ethmoid sinuses, as above. No acute features.

## 2021-12-19 ENCOUNTER — Encounter (HOSPITAL_COMMUNITY): Payer: Self-pay | Admitting: Emergency Medicine

## 2021-12-19 ENCOUNTER — Emergency Department (HOSPITAL_COMMUNITY)
Admission: EM | Admit: 2021-12-19 | Discharge: 2021-12-20 | Disposition: A | Discharge status: Home / Self Care | Payer: Commercial Managed Care - HMO | Attending: Emergency Medicine | Admitting: Emergency Medicine

## 2021-12-19 DIAGNOSIS — F1910 Other psychoactive substance abuse, uncomplicated: Secondary | ICD-10-CM

## 2021-12-19 DIAGNOSIS — Z79899 Other long term (current) drug therapy: Secondary | ICD-10-CM | POA: Insufficient documentation

## 2021-12-19 DIAGNOSIS — Y903 Blood alcohol level of 60-79 mg/100 ml: Secondary | ICD-10-CM | POA: Diagnosis not present

## 2021-12-19 DIAGNOSIS — Z20822 Contact with and (suspected) exposure to covid-19: Secondary | ICD-10-CM | POA: Insufficient documentation

## 2021-12-19 LAB — CBC WITH DIFFERENTIAL/PLATELET
Abs Immature Granulocytes: 0.06 10*3/uL (ref 0.00–0.07)
Basophils Absolute: 0 10*3/uL (ref 0.0–0.1)
Basophils Relative: 1 %
Eosinophils Absolute: 0.3 10*3/uL (ref 0.0–0.5)
Eosinophils Relative: 4 %
HCT: 44.5 % (ref 39.0–52.0)
Hemoglobin: 15.2 g/dL (ref 13.0–17.0)
Immature Granulocytes: 1 %
Lymphocytes Relative: 35 %
Lymphs Abs: 2.5 10*3/uL (ref 0.7–4.0)
MCH: 30.5 pg (ref 26.0–34.0)
MCHC: 34.2 g/dL (ref 30.0–36.0)
MCV: 89.2 fL (ref 80.0–100.0)
Monocytes Absolute: 0.5 10*3/uL (ref 0.1–1.0)
Monocytes Relative: 7 %
Neutro Abs: 3.8 10*3/uL (ref 1.7–7.7)
Neutrophils Relative %: 52 %
Platelets: 230 10*3/uL (ref 150–400)
RBC: 4.99 MIL/uL (ref 4.22–5.81)
RDW: 12.2 % (ref 11.5–15.5)
WBC: 7.1 10*3/uL (ref 4.0–10.5)
nRBC: 0 % (ref 0.0–0.2)

## 2021-12-19 NOTE — ED Triage Notes (Signed)
Pt presented to ED stating "I want to be sober from everything". Pt endorses drug use today stating he has done numerous substances. Denies SI/HI.

## 2021-12-19 NOTE — ED Notes (Signed)
NA x3 vitals 

## 2021-12-19 NOTE — ED Notes (Signed)
Called pt for triage, no response 

## 2021-12-19 NOTE — ED Provider Triage Note (Signed)
Emergency Medicine Provider Triage Evaluation Note  Paul Mcdowell. , a 36 y.o. male  was evaluated in triage.  Pt complains of substance abuse.  Patient was brought in by police because "I was bugging up ", "I am not bugging anymore ".  Patient reports significant history of substance abuse, stating that he replaces 1 drug with another.  He does report a prior cardiac condition however is not having any chest pain on today's visit.  Patient does not have any complaints at this time.  Denies any SI, HI, visual or auditory hallucinations.  Review of Systems  Positive:  Negative:   Physical Exam  BP 116/75 (BP Location: Right Arm)   Pulse 76   Temp 97.9 F (36.6 C) (Oral)   Resp (!) 22   SpO2 99%  Gen:   Awake, no distress   Resp:  Normal effort  MSK:   Moves extremities without difficulty  Other:    Medical Decision Making  Medically screening exam initiated at 10:47 PM.  Appropriate orders placed.  [name]. was informed that the remainder of the evaluation will be completed by another provider, this initial triage assessment does not replace that evaluation, and the importance of remaining in the ED until their evaluation is complete.     [name], PA-C 12/19/21 2248

## 2021-12-20 ENCOUNTER — Other Ambulatory Visit: Payer: Self-pay

## 2021-12-20 LAB — COMPREHENSIVE METABOLIC PANEL
ALT: 19 U/L (ref 0–44)
AST: 21 U/L (ref 15–41)
Albumin: 3.9 g/dL (ref 3.5–5.0)
Alkaline Phosphatase: 50 U/L (ref 38–126)
Anion gap: 6 (ref 5–15)
BUN: 8 mg/dL (ref 6–20)
CO2: 27 mmol/L (ref 22–32)
Calcium: 9 mg/dL (ref 8.9–10.3)
Chloride: 108 mmol/L (ref 98–111)
Creatinine, Ser: 0.97 mg/dL (ref 0.61–1.24)
GFR, Estimated: >60 mL/min (ref >60)
Glucose, Bld: 93 mg/dL (ref 70–99)
Potassium: 4.5 mmol/L (ref 3.5–5.1)
Sodium: 141 mmol/L (ref 135–145)
Total Bilirubin: 0.4 mg/dL (ref 0.3–1.2)
Total Protein: 6.7 g/dL (ref 6.5–8.1)

## 2021-12-20 LAB — RAPID URINE DRUG SCREEN, HOSP PERFORMED
Amphetamines: NOT DETECTED
Barbiturates: NOT DETECTED
Benzodiazepines: NOT DETECTED
Cocaine: NOT DETECTED
Opiates: NOT DETECTED
Tetrahydrocannabinol: POSITIVE — AB

## 2021-12-20 LAB — RESP PANEL BY RT-PCR (FLU A&B, COVID) ARPGX2
Influenza A by PCR: NEGATIVE
Influenza B by PCR: NEGATIVE
SARS Coronavirus 2 by RT PCR: NEGATIVE

## 2021-12-20 LAB — ETHANOL: Alcohol, Ethyl (B): 76 mg/dL — ABNORMAL HIGH (ref <10)

## 2021-12-20 MED ORDER — ONDANSETRON 4 MG PO TBDP: 4.0000 mg | ORAL_TABLET | Freq: Three times a day (TID) | ORAL | 0 refills | Status: AC | PRN

## 2021-12-20 NOTE — ED Notes (Signed)
Pt ambulatory w/ steady gait to stretcher in hallway

## 2021-12-20 NOTE — Discharge Instructions (Signed)
It was a pleasure taking care of you today.  As discussed, I have included outpatient resources for substance abuse.  Please call tomorrow to schedule an appointment for further evaluation.  Continue taking your medications as previously prescribed.  I have included [facility].  Call to schedule an appointment to establish care.  Return to the ER for any worsening symptoms.

## 2021-12-20 NOTE — ED Provider Notes (Signed)
[facility] EMERGENCY DEPARTMENT Provider Note   CSN: [id number] Arrival date & time: 12/19/21  2019     History  Chief Complaint  Patient presents with   Addiction Problem    Paul Rock. is a 36 y.o. male with a past medical history significant for polysubstance abuse who presents to the ED requesting detox.  Patient states he last used methamphetamines a few days ago.  Denies other drug use.  Denies SI, HI, and auditory/visual hallucinations.  No current physical complaints apart from baseline.  Admits to alcohol use.  History obtained from patient and past medical records. No interpreter used during encounter.       Home Medications Prior to Admission medications   Medication Sig Start Date End Date Taking? Authorizing Provider  ondansetron (ZOFRAN-ODT) 4 MG disintegrating tablet Take 1 tablet (4 mg total) by mouth every 8 (eight) hours as needed for nausea or vomiting. 12/20/21  Yes Anguel Delapena C, PA-C  albuterol (PROVENTIL) (2.5 MG/3ML) 0.083% nebulizer solution Take 2.5 mg by nebulization every 6 (six) hours as needed for wheezing or shortness of breath.    [provider]  albuterol (VENTOLIN HFA) 108 (90 Base) MCG/ACT inhaler Inhale 1 puff into the lungs every 6 (six) hours as needed for wheezing or shortness of breath.    [provider]  amoxicillin (AMOXIL) 500 MG capsule Take 1 capsule (500 mg total) by mouth 3 (three) times daily. 03/15/20   Orpah Greek, MD  diphenhydramine-acetaminophen (TYLENOL PM) 25-500 MG TABS tablet Take 2 tablets by mouth at bedtime as needed (pain).    [provider]  folic acid (FOLVITE) 1 MG tablet TAKE 1 TABLET BY MOUTH EVERY DAY Patient taking differently: Take 1 mg by mouth daily.  02/20/18   Lyda Jester M, PA-C  multivitamin (ONE-A-DAY MEN'S) TABS tablet Take 1 tablet by mouth daily. 01/21/17   Lyda Jester M, PA-C  olmesartan (BENICAR) 20 MG tablet TAKE 1  TABLET BY MOUTH EVERY DAY Patient taking differently: Take 20 mg by mouth daily.  06/21/18   Lorretta Harp, MD      Allergies    Molds & smuts    Review of Systems   Review of Systems  Constitutional:  Negative for chills and fever.  HENT:  Negative for rhinorrhea and sore throat.   Eyes:  Negative for visual disturbance.  Respiratory:  Negative for shortness of breath.   Cardiovascular:  Negative for chest pain and palpitations.  Gastrointestinal:  Negative for abdominal pain.  Genitourinary:  Negative for dysuria.  Musculoskeletal:  Negative for myalgias.  Skin:  Negative for color change and rash.  Neurological:  Negative for dizziness and light-headedness.  Psychiatric/Behavioral:  Negative for hallucinations, self-injury and suicidal ideas.     Physical Exam Updated Vital Signs BP 112/69 (BP Location: Right Arm)   Pulse 61   Temp 98 F (36.7 C) (Oral)   Resp 16   Ht 5\' 11"  (1.803 m)   Wt 70.3 kg   SpO2 98%   BMI 21.62 kg/m  Physical Exam Vitals and nursing note reviewed.  Constitutional:      General: He is not in acute distress.    Appearance: He is not ill-appearing.  HENT:     Head: Normocephalic.  Eyes:     Pupils: Pupils are equal, round, and reactive to light.  Cardiovascular:     Rate and Rhythm: Normal rate and regular rhythm.     Pulses: Normal pulses.  Heart sounds: Normal heart sounds. No murmur heard.    No friction rub. No gallop.  Pulmonary:     Effort: Pulmonary effort is normal.     Breath sounds: Normal breath sounds.  Abdominal:     General: Abdomen is flat. There is no distension.     Palpations: Abdomen is soft.     Tenderness: There is no abdominal tenderness. There is no guarding or rebound.  Musculoskeletal:        General: Normal range of motion.     Cervical back: Neck supple.  Skin:    General: Skin is warm and dry.  Neurological:     General: No focal deficit present.     Mental Status: He is alert.  Psychiatric:         Mood and Affect: Mood normal.        Behavior: Behavior normal.     ED Results / Procedures / Treatments   Labs (all labs ordered are listed, but only abnormal results are displayed) Labs Reviewed  ETHANOL - Abnormal; Notable for the following components:      Result Value   Alcohol, Ethyl (B) 76 (*)    All other components within normal limits  RAPID URINE DRUG SCREEN, HOSP PERFORMED - Abnormal; Notable for the following components:   Tetrahydrocannabinol POSITIVE (*)    All other components within normal limits  RESP PANEL BY RT-PCR (FLU A&B, COVID) ARPGX2  COMPREHENSIVE METABOLIC PANEL  CBC WITH DIFFERENTIAL/PLATELET    EKG EKG Interpretation  Date/Time:  Sunday December 20 2021 12:25:16 EDT Ventricular Rate:  56 PR Interval:  144 QRS Duration: 92 QT Interval:  440 QTC Calculation: 424 R Axis:   89 Text Interpretation: Sinus bradycardia Otherwise normal ECG When compared with ECG of 27-Oct-2017 20:10, PREVIOUS ECG IS PRESENT No significant change since last tracing Confirmed by Paul Mcdowell [phone]) on 12/20/2021 12:49:57 PM  Radiology No results found.  Procedures Procedures    Medications Ordered in ED Medications - No data to display  ED Course/ Medical Decision Making/ A&P Clinical Course as of 12/20/21 1513  Sun Dec 20, 2021  1210 Alcohol, Ethyl (B)(!): 76 [CA]    Clinical Course User Index [CA] Suzy Bouchard, PA-C                           Medical Decision Making Amount and/or Complexity of Data Reviewed Labs:  Decision-making details documented in ED Course.  Risk Prescription drug management.   36 year old male with history of polysubstance abuse presents to the ED requesting detox.  Patient denies SI, HI, and auditory/visual hallucinations.  Patient states he uses "ice cream" which he notes is methamphetamines.  Patient has an extensive cardiac history.  Denies any change in cardiac symptoms.  Upon arrival, patient afebrile, not  tachycardic or hypoxic.  Patient in no acute distress.  Unfortunately, patient waited 15 hours prior to my initial evaluation due to long wait times.  Patient has no evidence of psychosis.  No suicidal or homicidal ideations.  Do not feel patient needs psychiatric evaluation.  Will discharge patient with outpatient resources for substance abuse.  EKG obtained due to cardiac history.  EKG demonstrates sinus bradycardia.  No signs of acute ischemia.  Patient discharged with outpatient resources. Strict ED precautions discussed with patient. Patient states understanding and agrees to plan. Patient discharged home in no acute distress and stable vitals        Final Clinical  Impression(s) / ED Diagnoses Final diagnoses:  Polysubstance abuse ([place])    Rx / DC Orders ED Discharge Orders          Ordered    ondansetron (ZOFRAN-ODT) 4 MG disintegrating tablet  Every 8 hours PRN        12/20/21 1304              Suzy Bouchard, PA-C 12/20/21 1513    Truddie Hidden, MD 12/20/21 1546

## 2023-06-08 ENCOUNTER — Emergency Department (HOSPITAL_COMMUNITY): Payer: Self-pay

## 2023-06-08 ENCOUNTER — Emergency Department (HOSPITAL_COMMUNITY)
Admission: EM | Admit: 2023-06-08 | Discharge: 2023-06-08 | Disposition: A | Discharge status: Home / Self Care | Payer: Self-pay | Attending: Emergency Medicine | Admitting: Emergency Medicine

## 2023-06-08 ENCOUNTER — Other Ambulatory Visit: Payer: Self-pay

## 2023-06-08 ENCOUNTER — Encounter (HOSPITAL_COMMUNITY): Payer: Self-pay

## 2023-06-08 DIAGNOSIS — T68XXXA Hypothermia, initial encounter: Secondary | ICD-10-CM | POA: Insufficient documentation

## 2023-06-08 DIAGNOSIS — F15920 Other stimulant use, unspecified with intoxication, uncomplicated: Secondary | ICD-10-CM

## 2023-06-08 DIAGNOSIS — X31XXXA Exposure to excessive natural cold, initial encounter: Secondary | ICD-10-CM | POA: Insufficient documentation

## 2023-06-08 DIAGNOSIS — W19XXXA Unspecified fall, initial encounter: Secondary | ICD-10-CM

## 2023-06-08 DIAGNOSIS — E876 Hypokalemia: Secondary | ICD-10-CM

## 2023-06-08 DIAGNOSIS — F1210 Cannabis abuse, uncomplicated: Secondary | ICD-10-CM

## 2023-06-08 LAB — RAPID URINE DRUG SCREEN, HOSP PERFORMED
Amphetamines: POSITIVE — AB
Barbiturates: NOT DETECTED
Benzodiazepines: NOT DETECTED
Cocaine: NOT DETECTED
Opiates: NOT DETECTED
Tetrahydrocannabinol: POSITIVE — AB

## 2023-06-08 LAB — CBC WITH DIFFERENTIAL/PLATELET
Abs Immature Granulocytes: 0.1 10*3/uL — ABNORMAL HIGH (ref 0.00–0.07)
Basophils Absolute: 0 10*3/uL (ref 0.0–0.1)
Basophils Relative: 0 %
Eosinophils Absolute: 0 10*3/uL (ref 0.0–0.5)
Eosinophils Relative: 0 %
HCT: 41.6 % (ref 39.0–52.0)
Hemoglobin: 14.5 g/dL (ref 13.0–17.0)
Immature Granulocytes: 1 %
Lymphocytes Relative: 6 %
Lymphs Abs: 1 10*3/uL (ref 0.7–4.0)
MCH: 30 pg (ref 26.0–34.0)
MCHC: 34.9 g/dL (ref 30.0–36.0)
MCV: 86 fL (ref 80.0–100.0)
Monocytes Absolute: 0.8 10*3/uL (ref 0.1–1.0)
Monocytes Relative: 5 %
Neutro Abs: 14.4 10*3/uL — ABNORMAL HIGH (ref 1.7–7.7)
Neutrophils Relative %: 88 %
Platelets: 282 10*3/uL (ref 150–400)
RBC: 4.84 MIL/uL (ref 4.22–5.81)
RDW: 12 % (ref 11.5–15.5)
WBC: 16.3 10*3/uL — ABNORMAL HIGH (ref 4.0–10.5)
nRBC: 0 % (ref 0.0–0.2)

## 2023-06-08 LAB — COMPREHENSIVE METABOLIC PANEL WITH GFR
ALT: 32 U/L (ref 0–44)
AST: 186 U/L — ABNORMAL HIGH (ref 15–41)
Albumin: 4.6 g/dL (ref 3.5–5.0)
Alkaline Phosphatase: 65 U/L (ref 38–126)
Anion gap: 14 (ref 5–15)
BUN: 24 mg/dL — ABNORMAL HIGH (ref 6–20)
CO2: 20 mmol/L — ABNORMAL LOW (ref 22–32)
Calcium: 9.3 mg/dL (ref 8.9–10.3)
Chloride: 97 mmol/L — ABNORMAL LOW (ref 98–111)
Creatinine, Ser: 1.37 mg/dL — ABNORMAL HIGH (ref 0.61–1.24)
GFR, Estimated: >60 mL/min (ref >60)
Glucose, Bld: 256 mg/dL — ABNORMAL HIGH (ref 70–99)
Potassium: 3.2 mmol/L — ABNORMAL LOW (ref 3.5–5.1)
Sodium: 131 mmol/L — ABNORMAL LOW (ref 135–145)
Total Bilirubin: 0.9 mg/dL (ref 0.0–1.2)
Total Protein: 7.8 g/dL (ref 6.5–8.1)

## 2023-06-08 LAB — ETHANOL: Alcohol, Ethyl (B): <10 mg/dL (ref <10)

## 2023-06-08 LAB — URINALYSIS, ROUTINE W REFLEX MICROSCOPIC
Bacteria, UA: NONE SEEN
Bilirubin Urine: NEGATIVE
Glucose, UA: 50 mg/dL — AB
Ketones, ur: 20 mg/dL — AB
Leukocytes,Ua: NEGATIVE
Nitrite: NEGATIVE
Protein, ur: 30 mg/dL — AB
Specific Gravity, Urine: 1.016 (ref 1.005–1.030)
pH: 5 (ref 5.0–8.0)

## 2023-06-08 LAB — I-STAT CG4 LACTIC ACID, ED
Lactic Acid, Venous: 2.3 mmol/L (ref 0.5–1.9)
Lactic Acid, Venous: 0.9 mmol/L (ref 0.5–1.9)

## 2023-06-08 LAB — MAGNESIUM: Magnesium: 2.4 mg/dL (ref 1.7–2.4)

## 2023-06-08 MED ORDER — LACTATED RINGERS IV BOLUS
250.0000 mL | Freq: Once | INTRAVENOUS | Status: AC
  Administered 2023-06-08: 250 mL via INTRAVENOUS

## 2023-06-08 MED ORDER — LIDOCAINE 5 % EX PTCH
1.0000 | MEDICATED_PATCH | CUTANEOUS | Status: DC
Start: 2023-06-08 — End: 2023-06-08
  Administered 2023-06-08: 1 via TRANSDERMAL
  Filled 2023-06-08: qty 1

## 2023-06-08 MED ORDER — ACETAMINOPHEN 325 MG PO TABS
650.0000 mg | ORAL_TABLET | Freq: Once | ORAL | Status: AC
  Administered 2023-06-08: 650 mg via ORAL
  Filled 2023-06-08: qty 2

## 2023-06-08 MED ORDER — POTASSIUM CHLORIDE CRYS ER 20 MEQ PO TBCR
40.0000 meq | EXTENDED_RELEASE_TABLET | Freq: Once | ORAL | Status: AC
Start: 2023-06-08 — End: 2023-06-08
  Administered 2023-06-08: 40 meq via ORAL
  Filled 2023-06-08: qty 2

## 2023-06-08 MED ORDER — IOHEXOL 300 MG/ML  SOLN
100.0000 mL | Freq: Once | INTRAMUSCULAR | Status: AC | PRN
  Administered 2023-06-08: 100 mL via INTRAVENOUS

## 2023-06-08 NOTE — ED Notes (Signed)
 RN provided pt paper scrubs  Pt clothes were cut during en route. Pt verbalized to throw top and bottoms away.

## 2023-06-08 NOTE — ED Notes (Signed)
 Bair hugger applied.

## 2023-06-08 NOTE — ED Notes (Signed)
 Pt tearful about life and requested to speak with someone for emotional support and rehab.  RN reached out to CSW

## 2023-06-08 NOTE — ED Notes (Signed)
 Patient transported to CT

## 2023-06-08 NOTE — ED Notes (Signed)
 Pt was provided a urinal and he is c/o left arm numbness with burning, right hand tingling, LUQ painful as if something is poking, and left ear feeling like he is under water.    RN notified EDP

## 2023-06-08 NOTE — ED Notes (Signed)
Pt provided a urinal at bedside.

## 2023-06-08 NOTE — Progress Notes (Signed)
 CSW spoke with RN who states patient is seeking rehab for learning and resources for Alcoholics Anonymous, Narcotics Anonymous, and outpatient substance abuse treatment options.  CSW added resources to patient's AVS per his request.  Shepard Dicker, MSW, LCSW Transitions of Care  Clinical Social Worker II [phone]

## 2023-06-08 NOTE — ED Provider Notes (Signed)
 Patient given in sign out by [place], PA-C.  Please review their note for patient HPI, physical exam, workup.  At this time the plan is to reevaluate and follow-up on labs and imaging.  I went to go evaluate the patient and patient is somnolent however able to answer my questions.  Patient's pupils do appear dilated bilaterally but reactive to light.  Patient is still under the bear hugger we are still waiting on urine and imaging.  By my independent interpretation of the cardiac monitor patient is in normal sinus rhythm.  Patient does have history of substance abuse and given that he was hypothermic when he came in which could be environmental as it was in the 40s last night and is unclear how long patient was outside for along with being acutely altered and dilated pupils do question if this is opiate vs THC intoxication.  Will try to reach out to the grandmother in the chart to see if she can provide further details.  Patient has a history of moderate to severe aortic regurgitation due to bicuspid AV and so we will hold off on fluids to prevent flash pulmonary edema.  Although patient is hypothermic here along with having a white count and lactic acid at this time do not suspect sepsis as hypothermia could be environmental and it is unclear as to the patient's history.  Patient was reevaluated.  By my independent interpretation of the patient's cardiac monitor, the monitor shows normal sinus.  Patient exam shows somnolent state is seen previously with no new changes.  Patient's current rectal temp after the Bair hugger is 95.6 F.  Patient does have lactic acid 2.3 and after discussion with the attending we will give 250 mL of lactated ringer.  Patient was reevaluated.  By my independent interpretation of the patient's cardiac monitor, the monitor shows normal sinus.  Patient exam shows no new changes in patient's mental status.  When asked the patient how he is doing he gives me a thumbs up.  I tried to  reach out to the grandmother however I was sent to voicemail.  HIPAA compliant voicemail was left.  CT imaging was unremarkable. Patient was reevaluated.  By my independent interpretation of the patient's cardiac monitor, the monitor shows normal sinus.  Patient exam shows tenderness to the left lower ribs without bony abnormalities.  Patient does have some left upper quadrant tenderness as well but no peritoneal signs.  Given the fact that patient is continuing to endorse pain here and his history is unclear we will get CT imaging to rule out any kind of splenic laceration or other pathologies including fracture.  CT does not show any acute findings.  They do note some patchy opacities however state that these are nonspecific and patient did not endorse any coughing nor is he had a cough here nor having any chest pain and so I do feel that this is chronic.  Patient's UA shows large hemoglobin however CT scan did not show any signs of a stone.  UDS does show THC and amphetamine which would contribute to patient's current symptoms.  Patient is temperature at this time is 97.8 F.  Lactic acid was negative.  I discussed with the attending and we agree that patient is able to be discharged if he is able to ambulate and keep fluids down.  Patient is agreeable to this as he states he needs to get back home to his tent.  Will recommend that he follows up with her  primary care provider and we discussed return precautions.  All patient's questions were answered to his satisfaction.  Patient was able to ambulate and tolerate p.o. and is thus passed p.o. challenge.  Patient given return precautions.  Patient safe to be discharged at this time.  Will recommend that he follows up with primary care provider.  Patient verbalized understanding and acceptance of this plan.  Staffed with Morris Arch, MD; Monnie Anthony, MD   Elex Grimmer 06/08/23 1342    Trish Furl, MD 06/09/23 [id number]

## 2023-06-08 NOTE — ED Notes (Signed)
 Pt bair hugger temperature adjusted from high to medium. Pt current rectal temp 95.6

## 2023-06-08 NOTE — ED Provider Notes (Signed)
 [place] EMERGENCY DEPARTMENT AT [facility] Provider Note   CSN: [id number] Arrival date & time: 06/08/23  4098     History  Chief Complaint  Patient presents with   Paul Mcdowell. is a 38 y.o. male.  Patient to ED by EMS called from a convenience store referencing fall. The patient reports hitting his head and getting lost because he couldn't remember where he was going, but cannot provide details. On arrival, rectal temp found to be 91.5.   The history is provided by the patient and the EMS personnel. No language interpreter was used.  Fall       Home Medications Prior to Admission medications   Medication Sig Start Date End Date Taking? Authorizing Provider  albuterol  (PROVENTIL ) (2.5 MG/3ML) 0.083% nebulizer solution Take 2.5 mg by nebulization every 6 (six) hours as needed for wheezing or shortness of breath.    [provider]  albuterol  (VENTOLIN  HFA) 108 (90 Base) MCG/ACT inhaler Inhale 1 puff into the lungs every 6 (six) hours as needed for wheezing or shortness of breath.    [provider]  amoxicillin  (AMOXIL ) 500 MG capsule Take 1 capsule (500 mg total) by mouth 3 (three) times daily. 03/15/20   Pollina, Christopher J, MD  diphenhydramine-acetaminophen  (TYLENOL  PM) 25-500 MG TABS tablet Take 2 tablets by mouth at bedtime as needed (pain).    [provider]  folic acid  (FOLVITE ) 1 MG tablet TAKE 1 TABLET BY MOUTH EVERY DAY Patient taking differently: Take 1 mg by mouth daily.  02/20/18   Ruddy Corral M, PA-C  multivitamin (ONE-A-DAY MEN'S) TABS tablet Take 1 tablet by mouth daily. 01/21/17   Ruddy Corral M, PA-C  olmesartan  (BENICAR ) 20 MG tablet TAKE 1 TABLET BY MOUTH EVERY DAY Patient taking differently: Take 20 mg by mouth daily.  06/21/18   Avanell Leigh, MD  ondansetron  (ZOFRAN -ODT) 4 MG disintegrating tablet Take 1 tablet (4 mg total) by mouth every 8 (eight) hours as needed for nausea or  vomiting. 12/20/21   Lear Prosper, PA-C      Allergies    Molds & smuts    Review of Systems   Review of Systems  Physical Exam Updated Vital Signs BP 118/81   Pulse 62   Temp (!) 91.5 F (33.1 C) (Rectal)   Resp 19   Ht 5\' 11"  (1.803 m)   Wt 70.3 kg   SpO2 100%   BMI 21.62 kg/m  Physical Exam Vitals and nursing note reviewed.  Constitutional:      Appearance: He is well-developed.  HENT:     Head: Normocephalic.     Nose: Nose normal.     Mouth/Throat:     Mouth: Mucous membranes are moist.     Comments: Poor dentition.  Cardiovascular:     Rate and Rhythm: Normal rate and regular rhythm.     Heart sounds: No murmur heard. Pulmonary:     Effort: Pulmonary effort is normal.     Breath sounds: Normal breath sounds. No wheezing, rhonchi or rales.  Chest:     Chest wall: No tenderness.  Abdominal:     Palpations: Abdomen is soft.     Tenderness: There is no abdominal tenderness. There is no guarding or rebound.     Comments: No bruising of abdominal wall  Musculoskeletal:        General: No tenderness. Normal range of motion.     Cervical back: Normal range  of motion and neck supple.     Comments: Moves all extremities. No joint swelling. No deformities. Nontender musculature. No wounds or bleeding.   Skin:    General: Skin is warm and dry.  Neurological:     General: No focal deficit present.     Mental Status: He is alert.     Sensory: No sensory deficit.     Comments: Oriented to person, place and time     ED Results / Procedures / Treatments   Labs (all labs ordered are listed, but only abnormal results are displayed) Labs Reviewed  CBC WITH DIFFERENTIAL/PLATELET - Abnormal; Notable for the following components:      Result Value   WBC 16.3 (*)    Neutro Abs 14.4 (*)    Abs Immature Granulocytes 0.10 (*)    All other components within normal limits  COMPREHENSIVE METABOLIC PANEL WITH GFR - Abnormal; Notable for the following components:    Sodium 131 (*)    Potassium 3.2 (*)    Chloride 97 (*)    CO2 20 (*)    Glucose, Bld 256 (*)    BUN 24 (*)    Creatinine, Ser 1.37 (*)    AST 186 (*)    All other components within normal limits  I-STAT CG4 LACTIC ACID, ED - Abnormal; Notable for the following components:   Lactic Acid, Venous 2.3 (*)    All other components within normal limits  ETHANOL  RAPID URINE DRUG SCREEN, HOSP PERFORMED  URINALYSIS, ROUTINE W REFLEX MICROSCOPIC    EKG None  Radiology DG Chest Portable 1 View Result Date: 06/08/2023 CLINICAL DATA:  Altered mental status, fall injury. EXAM: PORTABLE CHEST 1 VIEW COMPARISON:  PA and lateral chest 01/20/2019 FINDINGS: The heart size and mediastinal contours are within normal limits. Both lungs are clear. The visualized skeletal structures are unremarkable apart from slight thoracic levoscoliosis. IMPRESSION: No evidence of acute chest disease. Slight thoracic levoscoliosis. Inspiration is shallower than previously, but otherwise no changes. Electronically Signed   By: Denman Fischer M.D.   On: 06/08/2023 05:48    Procedures Procedures    Medications Ordered in ED Medications - No data to display  ED Course/ Medical Decision Making/ A&P                                 Medical Decision Making This patient presents to the ED for concern of AMS, this involves an extensive number of treatment options, and is a complaint that carries with it a high risk of complications and morbidity.  The differential diagnosis includes head injury, intoxication, infection/sepsis, encephalopathy,    Co morbidities that complicate the patient evaluation  Alcoholism, polysubstance abuse   Additional history obtained:  Additional history and/or information obtained from chart review, notable for multiple visits for intoxication, dental caries/pain, seen by cardiology for aortic insufficiency   Lab Tests:  I Ordered, and personally interpreted labs.  The pertinent  results include:  pending at time of sign out to oncoming provider    Imaging Studies ordered:  I ordered imaging studies including CT head and c-spine I independently visualized and interpreted imaging which showed: pending at time of sign out to oncoming provider     Cardiac Monitoring:  The patient was maintained on a cardiac monitor.  I personally viewed and interpreted the cardiac monitored which showed an underlying rhythm of: Sinus rhythm, nonspecific T-wave abnormality  Test Considered:  N/a   Critical Interventions:  Bair hugger, warm blankets - hypothermia with rectal temp 91.5   Problem List / ED Course:  Patient to ED by EMS, called by convenience store, reference fall Hypothermic on arrival - Bair hugger, warm blankets started He is oriented but cannot contribute significantly to history - acknowledges fall "hit head"  At this point, patient care signed out to oncoming provider at end of shift.    Reevaluation: pending at time of sign out to oncoming provider   Social Determinants of Health:  H/o alcoholism   Disposition:  After consideration of the diagnostic results and the patients response to treatment, I feel that the patient would benefit from: pending at time of sign out to oncoming provider    Amount and/or Complexity of Data Reviewed Labs: ordered. Radiology: ordered.           Final Clinical Impression(s) / ED Diagnoses Final diagnoses:  Hypothermia, initial encounter    Rx / DC Orders ED Discharge Orders     None         Rama Burkitt 06/08/23 4098    Lindle Rhea, MD 06/10/23 1827

## 2023-06-08 NOTE — ED Notes (Signed)
 RN discussed resources on discharge paperwork with pt. Pt verbalized understanding. Pt was provided bag lunch and beverage. Pt has boots in belongings bag and provided slip resistant socks.  Pt ambulated to restroom and provided wash cloths, soap, and bag to freshen up.

## 2023-06-08 NOTE — ED Notes (Signed)
 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 Pt says he dibble in smoking or sniffing. Pt requested rehab for learning as well. He want to speak with people in a group setting that are uplifting.

## 2023-06-08 NOTE — Discharge Instructions (Addendum)
 Please follow-up with your primary care provider in regards to be symptoms and ER visit.  Today your labs and imaging are all reassuring and you most likely have bruises around your body.  You may take Tylenol every 6 as needed for pain.  If symptoms change or worsen please return to the ER.                                     Outpatient Substance Abuse  Treatment- uninsured  Narcotics Anonymous 24-HOUR HELPLINE Pre-recorded for Meeting Schedules [facility] [phone]  [email or web address] ALCOHOLICS ANONYMOUS  [facility]  Answering Service [phone] Please Note: All [place] Meetings are Non-smoking [email or web address].es  [facility] Drug Services -  Insurance: Medicaid /State funding/private insurance Methadone, suboxone/Intensive outpatient  [place]   [phone] Fax: [phone] [address], [place], [place], [address] [place] [phone] Fax: [phone]    [address] [place], [place], [address] ([address] [place], [place], [place], [place], [place], [place], [place], [place]) [facility] [email or web address] Accepts State funding/Medicaid Transitional housing, Intensive Outpatient Treatment, Outpatient treatment, Veterans Services  Phone: [phone] Fax: [phone] Address: [address], [place] [address]   [employer] [employer] [facility] ([email or web address].[email or web address]/) Insurance: Medicaid Case Management, Administrator, arts, Medication Management, Outpatient Therapy, Psychosocial Rehabilitation, Substance Abuse Intensive Outpatient  Phone: [phone] Fax: [phone] [address], [place], [place], [address]  [facility] Medicaid, most private insurance providers Types of Program: Individual/Group Therapy, Substance Abuse Treatment  Phone: [place] [facility] [phone] Fax: [phone] [address], [place], [place], [address] [place] [phone] [address], [address]  [place], [place], [address]  [facility]  Medicaid Types of Program: SAIOP  Phone: [phone] Fax: [phone] [place], [place], [place], [address] [facility] Medicaid/state funds Crisis line [phone] [facility] [phone] [place] [phone] [place] [phone]  [facility] [address];  [place], [place] [address] [phone]  Substance Abuse Intensive Outpatient Program [facility] [address] [place], [place] [address] [phone] Substance abuse treatment  [facility]  Insurance: [facility], [address], sliding scale Types of Program: substance abuse treatment, transportation assistance Phone: [phone] Fax: [phone] Address: [address], [place] [address] [facility] ([email or web address]) Insurance: UHC, [place], [place], [place] Program: addiction counseling, detoxification,  Phone: [phone]  Fax: [phone] Address: [address] [place], [place] [address]  [facility] (statewide facilities/programs) [address] (Medicaid/state funds) [place], [place] [address]                      [email or web address] [phone] Marcy Panning- [phone] [place]- [phone] [facility] [place] (2 Locations) (Medicaid/state funds) --[address]  walk in 8:30-12 and 1-2:30 [place], [id number]   [facility]- [phone] --[address] [place], [place] [address]  TT-[phone] walk in 8:30-12 and 2-3:30  [facility] state funds/medicaid [address] [place], [place] [address] [phone] [facility] (Suboxone clinic) Medicaid/state funds  [address]  [place], [place] [address] [phone]   [facility]  [address] [place], [place] [address]  [phone] (24 hours) [place]- [address] [place], [place] [address]  [phone] (24  hours) [place]- [address] Alene Husk [phone] [place]- [address] [place] [phone] Car[place] Cl[place]- [address] [place] [phone] [facility]- Medicaid and state funds  [place]- [address] [place], [place] [address] [phone] (24 hours) [place]- [address] [place], [place] [address] [phone] [facility]- [address] [place], [place] [address] [phone] (24 hours) [place] [address] [place], [place]  [address] [phone] [place]- [address] [facility] [address] [place] [phone]
